# Patient Record
Sex: Male | Born: 1985 | Race: White | Hispanic: No | Marital: Married | Smoking: Never smoker
Health system: Southern US, Community
[De-identification: ages and names within clinical notes are randomized; demographics above are authoritative.]

## PROBLEM LIST (undated history)

## (undated) DIAGNOSIS — H409 Unspecified glaucoma: Secondary | ICD-10-CM

## (undated) DIAGNOSIS — H269 Unspecified cataract: Secondary | ICD-10-CM

## (undated) DIAGNOSIS — H509 Unspecified strabismus: Secondary | ICD-10-CM

## (undated) DIAGNOSIS — Z9989 Dependence on other enabling machines and devices: Secondary | ICD-10-CM

## (undated) DIAGNOSIS — H53001 Unspecified amblyopia, right eye: Secondary | ICD-10-CM

## (undated) DIAGNOSIS — Z973 Presence of spectacles and contact lenses: Secondary | ICD-10-CM

## (undated) DIAGNOSIS — G4730 Sleep apnea, unspecified: Secondary | ICD-10-CM

## (undated) DIAGNOSIS — H179 Unspecified corneal scar and opacity: Secondary | ICD-10-CM

## (undated) DIAGNOSIS — Q134 Other congenital corneal malformations: Secondary | ICD-10-CM

## (undated) DIAGNOSIS — Q1389 Other congenital malformations of anterior segment of eye: Secondary | ICD-10-CM

## (undated) HISTORY — PX: CATARACT EXTRACTION, BILATERAL: SHX1313

## (undated) HISTORY — DX: Unspecified cataract: H26.9

## (undated) HISTORY — DX: Unspecified glaucoma: H40.9

## (undated) HISTORY — DX: Unspecified strabismus: H50.9

## (undated) HISTORY — DX: Unspecified amblyopia, right eye: H53.001

## (undated) HISTORY — DX: Other congenital malformations of anterior segment of eye: Q13.89

## (undated) HISTORY — DX: Unspecified corneal scar and opacity: H17.9

## (undated) HISTORY — DX: Other congenital corneal malformations: Q13.4

---
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
## 2004-11-16 ENCOUNTER — Ambulatory Visit (INDEPENDENT_AMBULATORY_CARE_PROVIDER_SITE_OTHER): Payer: Self-pay

## 2005-11-09 ENCOUNTER — Ambulatory Visit (INDEPENDENT_AMBULATORY_CARE_PROVIDER_SITE_OTHER): Payer: Self-pay | Admitting: Ophthalmology

## 2006-10-24 ENCOUNTER — Encounter (INDEPENDENT_AMBULATORY_CARE_PROVIDER_SITE_OTHER): Payer: No Typology Code available for payment source | Admitting: Ophthalmology

## 2006-11-04 ENCOUNTER — Ambulatory Visit (INDEPENDENT_AMBULATORY_CARE_PROVIDER_SITE_OTHER): Payer: Self-pay | Admitting: Ophthalmology

## 2006-11-23 ENCOUNTER — Ambulatory Visit
Admission: RE | Admit: 2006-11-23 | Discharge: 2006-11-23 | Disposition: A | Discharge status: Home / Self Care | Payer: Self-pay | Source: Ambulatory Visit

## 2007-10-30 ENCOUNTER — Ambulatory Visit: Payer: No Typology Code available for payment source

## 2007-11-10 ENCOUNTER — Encounter (INDEPENDENT_AMBULATORY_CARE_PROVIDER_SITE_OTHER): Payer: No Typology Code available for payment source | Admitting: Ophthalmology

## 2007-12-22 ENCOUNTER — Ambulatory Visit
Admission: RE | Admit: 2007-12-22 | Discharge: 2007-12-22 | Disposition: A | Discharge status: Home / Self Care | Payer: No Typology Code available for payment source | Attending: Ophthalmology | Admitting: Ophthalmology

## 2007-12-22 ENCOUNTER — Ambulatory Visit (INDEPENDENT_AMBULATORY_CARE_PROVIDER_SITE_OTHER): Payer: No Typology Code available for payment source | Admitting: Ophthalmology

## 2007-12-22 DIAGNOSIS — H53009 Unspecified amblyopia, unspecified eye: Secondary | ICD-10-CM | POA: Insufficient documentation

## 2007-12-22 DIAGNOSIS — H53149 Visual discomfort, unspecified: Secondary | ICD-10-CM | POA: Insufficient documentation

## 2007-12-22 DIAGNOSIS — H269 Unspecified cataract: Secondary | ICD-10-CM | POA: Insufficient documentation

## 2007-12-22 DIAGNOSIS — Z9849 Cataract extraction status, unspecified eye: Secondary | ICD-10-CM | POA: Insufficient documentation

## 2007-12-22 DIAGNOSIS — H179 Unspecified corneal scar and opacity: Secondary | ICD-10-CM | POA: Insufficient documentation

## 2007-12-22 DIAGNOSIS — Z961 Presence of intraocular lens: Secondary | ICD-10-CM | POA: Insufficient documentation

## 2007-12-22 DIAGNOSIS — H543 Unqualified visual loss, both eyes: Secondary | ICD-10-CM | POA: Insufficient documentation

## 2007-12-22 DIAGNOSIS — H43399 Other vitreous opacities, unspecified eye: Secondary | ICD-10-CM | POA: Insufficient documentation

## 2008-02-12 ENCOUNTER — Ambulatory Visit
Admission: RE | Admit: 2008-02-12 | Discharge: 2008-02-12 | Disposition: A | Discharge status: Home / Self Care | Payer: No Typology Code available for payment source

## 2008-02-12 ENCOUNTER — Encounter (HOSPITAL_COMMUNITY): Payer: Self-pay

## 2008-02-14 ENCOUNTER — Ambulatory Visit
Admission: RE | Admit: 2008-02-14 | Discharge: 2008-02-14 | Disposition: A | Discharge status: Home / Self Care | Payer: No Typology Code available for payment source

## 2008-02-15 ENCOUNTER — Ambulatory Visit (INDEPENDENT_AMBULATORY_CARE_PROVIDER_SITE_OTHER): Payer: No Typology Code available for payment source | Admitting: Medical

## 2008-02-15 ENCOUNTER — Ambulatory Visit
Admission: RE | Admit: 2008-02-15 | Discharge: 2008-02-15 | Disposition: A | Discharge status: Home / Self Care | Payer: No Typology Code available for payment source | Attending: Medical | Admitting: Medical

## 2008-02-15 DIAGNOSIS — Z01818 Encounter for other preprocedural examination: Secondary | ICD-10-CM | POA: Insufficient documentation

## 2008-02-15 NOTE — Patient Instructions (Signed)
Patient for routine EKG review with notable bradycardia. Will review and advise.

## 2008-02-15 NOTE — Progress Notes (Signed)
 [employer] Leon  [id number]  [birth year]    02/15/2008      [name]  [address]  [address]  [place], [place] [address]    Chief Complaint   Patient presents with   . Pre-op     pending eye surgery on 9/28         History of Present Illness:  Patient is a 22 year old referred for preoperative evaluation pending left eye cataract excision scheduled September 28, with Dr. GRAYSON. 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 Patient reports prior right eye surgery, without complications reported.        No past medical history on file: Patient denies any history of hypertension, coronary disease, valvular disease, COPD/asthma, diabetes, renal insufficiency, or cardiomyopathy. No recent hospitalizations or illnesses reported. No current medications      No past surgical history on file. Patient denies surgical history, other than prior right eye surgery as stated.    No current outpatient prescriptions on file.         Allergies   Allergen Reactions   . Nkda (No Known Drug Allergies)          No family history on file. non-contributory    History   Social History   . Marital Status: Single     Spouse Name: N/A     Number of Children: N/A   . Years of Education: N/A   Occupational History   . Not on file.   Social History Main Topics   . Tobacco Use: Never   . Alcohol Use: Yes      socially   . Drug Use: No   . Sexually Active: Not on file   Other Topics Concern   . Abuse/domestic Violence No   . Caffeine Concern No   . Seat Belt No     Sometimes   . Special Diet No   . Uses Gait Assitive Device ([employer]) No   Social History Narrative    Patient is a Consulting civil engineer at [employer].            Review Of Systems:    Objective:BP 110/60  Pulse 48  Temp (Src) 36.1 C (97 F) (Tympanic)  Ht 1.829 m (6')  Wt 87.544 kg (193 lb)  SpO2 99%  Patient is alert, cooperative, in no acute distress. Vital signs adequate, but  noted bradycardia.  Patient's pharynx was clear. Neck was supple without adenopathy or thyromegaly. No carotid bruits noted. Lungs were clear to auscultate without wheezing or rhonchi. Heart sounds were regular rate with slow rhythm without significant murmur. Abdomen soft without tenderness or organomegaly. Extremities without edema or deformity.    Pre Op Assessment & Plan/Recommendations:  1. Left eye cataract,  for excision. Due to patient's bradycardia, EKG was ordered and reviewed. First degree AV block noted, but without significant additional abnormality. No additional preoperative testing recommended. No increased perioperative risk identified. This was reviewed with Dr. Veleria.      Frederick Proby, PA-C

## 2008-02-19 ENCOUNTER — Encounter (HOSPITAL_COMMUNITY): Payer: Self-pay

## 2008-02-19 ENCOUNTER — Encounter (HOSPITAL_BASED_OUTPATIENT_CLINIC_OR_DEPARTMENT_OTHER): Payer: No Typology Code available for payment source | Admitting: Ophthalmology

## 2008-02-19 ENCOUNTER — Encounter (HOSPITAL_COMMUNITY)
Admission: RE | Disposition: A | Discharge status: Home / Self Care | Payer: Self-pay | Source: Ambulatory Visit | Attending: Ophthalmology

## 2008-02-19 ENCOUNTER — Inpatient Hospital Stay
Admission: RE | Admit: 2008-02-19 | Discharge: 2008-02-19 | Disposition: A | Discharge status: Home / Self Care | Payer: No Typology Code available for payment source | Attending: Ophthalmology | Admitting: Ophthalmology

## 2008-02-19 DIAGNOSIS — Q120 Congenital cataract: Secondary | ICD-10-CM | POA: Insufficient documentation

## 2008-02-19 HISTORY — PX: HX CATARACT REMOVAL: SHX102

## 2008-02-19 SURGERY — PHACO WITH INTRAOCULAR LENS
Anesthesia: Monitor Anesthesia Care | Site: Eye | Laterality: Left | Wound class: Clean Wound: Uninfected operative wounds in which no inflammation occurred

## 2008-02-19 MED ORDER — PREDNISOLONE ACETATE 1 % EYE DROPS,SUSPENSION
1.00 [drp] | Freq: Four times a day (QID) | OPHTHALMIC | Status: DC
Start: 2008-02-19 — End: 2008-07-23

## 2008-02-19 MED ORDER — FLURBIPROFEN 0.03 % EYE DROPS
1.00 [drp] | OPHTHALMIC | Status: AC
Start: 2008-02-19 — End: 2008-02-19
  Administered 2008-02-19 (×3): 1 [drp] via OPHTHALMIC

## 2008-02-19 MED ORDER — BALANCED SALT SOLUTION COMBINATION NO.2 INTRAOCULAR IRRIGATION
INTRAOCULAR | Status: DC
Start: 2008-02-19 — End: 2008-02-20
  Filled 2008-02-19: qty 1

## 2008-02-19 MED ORDER — MOXIFLOXACIN 0.5 % EYE DROPS
1.00 [drp] | OPHTHALMIC | Status: AC
Start: 2008-02-19 — End: 2008-02-19
  Administered 2008-02-19 (×3): 1 [drp] via OPHTHALMIC

## 2008-02-19 MED ORDER — CYCLOPENTOLATE 1 % EYE DROPS
1.00 [drp] | OPHTHALMIC | Status: AC
Start: 2008-02-19 — End: 2008-02-19
  Administered 2008-02-19 (×3): 1 [drp] via OPHTHALMIC

## 2008-02-19 MED ORDER — PHENYLEPHRINE 2.5 % EYE DROPS
1.00 [drp] | OPHTHALMIC | Status: AC
Start: 2008-02-19 — End: 2008-02-19
  Administered 2008-02-19 (×3): 1 [drp] via OPHTHALMIC

## 2008-02-19 MED ORDER — MOXIFLOXACIN 0.5 % EYE DROPS
1.00 [drp] | Freq: Four times a day (QID) | OPHTHALMIC | Status: DC
Start: 2008-02-19 — End: 2008-04-02

## 2008-02-19 MED ORDER — HYALURONIDASE (OVINE) 200 UNIT/ML INJECTION SOLUTION
Freq: Once | INTRAMUSCULAR | Status: AC
Start: 2008-02-19 — End: 2008-02-19

## 2008-02-19 MED ORDER — LACTATED RINGERS INTRAVENOUS SOLUTION
INTRAVENOUS | Status: DC
Start: 2008-02-19 — End: 2008-02-20

## 2008-02-19 MED ORDER — TOBRAMYCIN-DEXAMETHASONE 0.3 %-0.1 % EYE OINTMENT
TOPICAL_OINTMENT | Freq: Once | OPHTHALMIC | Status: DC | PRN
Start: 2008-02-19 — End: 2008-02-20

## 2008-02-19 MED ORDER — BALANCED SALT SOLUTION COMBINATION NO.2 INTRAOCULAR IRRIGATION
15.00 mL | Freq: Once | INTRAOCULAR | Status: DC | PRN
Start: 2008-02-19 — End: 2008-02-20
  Administered 2008-02-19: 15 mL

## 2008-02-19 SURGICAL SUPPLY — 19 items
APPLICATOR COT TIP STRL 3IN 9318100 100/CS (WOUND CARE SUPPLY) ×2 IMPLANT
CONV USE ITEM 156524 - ADHESIVE TISSUE EXOFIN 1.0ML_PREMIERPRO EXOFIN (SEALANTS) IMPLANT
GARTER EYE ASST_28-6536 (OPHTHALMIC SUPPLIES (NOT LENS)) ×2 IMPLANT
KNIFE OPTH 2.5MM BVR XSTR 55D CRSNT SS MATTE FNSH BVL UP FLXB STRL LF  DISP (OPHTHALMIC SUPPLIES (NOT LENS)) ×2 IMPLANT
KNIFE OPTH SLT BLU OPM 22.5D STR STAB INCS STRL LF (SURGICAL CUTTING SUPPLIES) ×2 IMPLANT
LENS IOL 0 D +21.5 DIOP MOD L_BICONVEX ACRYSOF NATURAL (Lens) ×2 IMPLANT
NEEDLE OPTH 1.5IN 25GA ATKNSN RTRBLBR LF (NEEDLES & SYRINGE SUPPLIES) ×2 IMPLANT
PACK CATARACT BX/6 (TRAY) ×2 IMPLANT
PACK VITRECT INFNT STRL DISP (OPHTHALMIC SUPPLIES (NOT LENS)) IMPLANT
PAD EYE 2.62X1.62IN SURECARE COTTON ABS LF  STRL (OPHTHALMIC SUPPLIES (NOT LENS)) ×2 IMPLANT
PAD EYE SURE CARE GAUZE 82911_25EA/TY 24TY/CS (OPTHALMIC SUPPLIES (NOT LENS)) ×1
PEN SURG MRKNG WRITESITE + RLR LBL SET 3X DARKER FORMULATE GNTN VIOL INK STRL LF  CHLRPRP (MISCELLANEOUS PT CARE ITEMS) ×2 IMPLANT
PEN SURG MRKNG WRITESITE + SKN_RLR LBL SET 3X DARKER (MISCELLANEOUS PT CARE ITEMS) ×1
SET BLOOD COL 23GA 12IN BD VAC SAF-LOK TUBE NO LL ADPR BTRFLY NEEDLE .75IN LIGHT BLU YW STRL LF (IV TUBING & ACCESSORIES) ×2 IMPLANT
SHIELD EYE 76.2X60.3MM ASST AL EY GRTR FOX CVR (OPHTHALMIC SUPPLIES (NOT LENS)) ×2 IMPLANT
SUTURE 10-0 CS160-6 ETHILON 12IN BLK 2 ARM MONOF NONAB (SUTURE/WOUND CLOSURE) ×2 IMPLANT
SUTURE ETHILON 10.0 CS140 6 ET9003G (SUTURE/WOUND CLOSURE) ×2 IMPLANT
SUTURE SOFSILK 4-0 HE3 CUT S1783K 12/BX (SUTURE/WOUND CLOSURE) IMPLANT
SYRINGE HYPO 12CC W/LL TIP 1181200777T 100/BX (Syringes w/ o Needles) ×2 IMPLANT

## 2008-02-19 NOTE — Discharge Instructions (Signed)
SURGICAL DISCHARGE INSTRUCTIONS     Dr. Lilia Pro Wiley  performed your [facility] WITH INTRAOCULAR LENS, VITRECTOMY ANTERIOR today at the [facility]    [facility]:  Monday through Friday from 6 a.m. - 7 p.m.: [phone]  Between 7 p.m. - 6 a.m., weekends and holidays:  Call [facility] at [phone] or [phone].    PLEASE SEE WRITTEN HANDOUTS AS DISCUSSED BY YOUR NURSE:  Eye surgery    SIGNS AND SYMPTOMS OF A WOUND / INCISION INFECTION   Be sure to watch for the following:   Increase in redness or red streaks near or around the wound or incision.  Increase in pain that is intense or severe and cannot be relieved by the pain medication that your doctor has given you.  Increase in swelling that cannot be relieved by elevation of a body part, or by applying ice, if permitted.  Increase in drainage, or if yellow / green in color and smells bad. This could be on a dressing or a cast.  Increase in fever for longer than 24 hours, or an increase that is higher than 101 degrees Fahrenheit (normal body temperature is 98 degrees Fahrenheit). The incision may feel warm to the touch.    **CALL YOUR DOCTOR IF ONE OR MORE OF THESE SIGNS / SYMPTOMS SHOULD OCCUR.    ANESTHESIA INFORMATION   LOCAL ANESTHETIC:  You have receieved a local anesthetic, the effects should disappear in a few hours. and ANESTHESIA -- ADULT PATIENTS:  You have received intravenous sedation / general anesthesia, and you may feel drowsy and light-headed for several hours. You may even experience some forgetfulness of the procedure. DO NOT DRIVE A MOTOR VEHICLE or perform any activity requiring complete alertness or coordination until you feel fully awake in about 24-48 hours. Do not drink alcoholic beverages for at least 24 hours. Do not stay alone, you must have a responsible adult available to be with you. You may also experience a dry mouth or nausea for 24 hours. This is a normal side effect and will disappear as the effects  of the medication wear off.    REMEMBER   If you experience any difficulty breathing, chest pain, bleeding that you feel is excessive, persistent nausea or vomiting or for any other concerns:  Call your physician Dr. Paris Lore at [phone] or [phone]. You may also ask to have the EYE doctor on call paged. They are available to you 24 hours a day.    SPECIAL INSTRUCTIONS / COMMENTS   DO NOT REMOVE EYE PATCH.  PHYSCIAN WIL;L REMOVE AT FOLLOW UP APPOINTMENT  TOMORROW.    FOLLOW-UP APPOINTMENTS   Please call patient services at [phone] or [phone] to schedule a date / time of return. They are open Monday - Friday from 7:30 am - 5:00 pm.

## 2008-02-19 NOTE — OR Nursing (Signed)
Duovisc 1.05ml in left eye per Dr. Wiley

## 2008-02-19 NOTE — OR Nursing (Signed)
CDE 2.58

## 2008-02-19 NOTE — OR PostOp (Signed)
Discharged with belongings via wheelchair accompanied by AMANDA DEBARR PCA

## 2008-02-20 ENCOUNTER — Encounter (INDEPENDENT_AMBULATORY_CARE_PROVIDER_SITE_OTHER): Payer: No Typology Code available for payment source | Admitting: Ophthalmology

## 2008-02-20 NOTE — OR Surgeon (Signed)
[facility]   DEPARTMENT OF OPHTHALMOLOGY    OPERATION SUMMARY    PATIENT NAME: DEIONTE, SPIVACK [facility] MWUXLK:[id number]  DATE OF SERVICE:02/19/2008  DATE OF BIRTH: 1985/11/09    PREOPERATIVE DIAGNOSIS: Cataract, posterior polar (left eye).    NAME OF PROCEDURE: Cataract extraction via phacoemulsification and implantation of posterior chamber intraocular lens, left eye.    ANESTHESIA: Local with standby.    SURGEONS: Courtney Heys MD (staff), Lacie Draft MD (assistant).    COMPLICATIONS: None.     DESCRIPTION OF PROCEDURE: The patient was taken to the operating room after being properly identified by me. He was given a peribulbar block using 2% lidocaine and 100 units of Vitrase per 10 mL. A specialized Atkinson needle was used to deliver 5 mL total volume followed by direct pressure to soften the globe. The periocular skin was prepped with Betadine solution with special attention to the lid margins and some of that solution placed on the eye surface. This was cleansed with saline and dried and the patient draped with an adhesive ophthalmic drape, incised and turned around the lids to exclude them from the operative field. Eyelids were separated with a wire speculum and a paracentesis site made inferiorly with a metal keratome allowing Viscoat to be instilled into the anterior chamber to deepen it. A clear temporal corneal incision was fashioned. The capsule flap was raised with a sharp cystitome and extended in a circular fashion. Because this was a posterior polar cataract, hydrodissection was done in 4 quadrants very gently, ensuring that the hydrodissection wave did not go posteriorly to the area of the cataract. The lens was hydrodelineated and emulsified using a CDE of 2.58. Cortex and epinucleus were removed with the I/A tip using an irrigant of BSS with 1 mL of 1:1000 epinephrine per 500-mL bottle. The implant, an SN60WF, 21.5 diopter power lens, serial #[id number], was placed in the [facility] inserter and used to insert the lens directly into the capsular bag. It was rotated so its haptics were at 12 and 6 o'clock, and as much viscoelastic as possible was evacuated with the I/A unit. The wound was closed with single interrupted 10-0 nylon suture and checked for leaks, and none were seen. TobraDex ointment was instilled and the eye protected with a patch and shield. The patient was taken from the operating room to the recovery room in good condition.      Darylene Price, MD  Associate Professor   Cornea and External Disease Service  [place] Department of Ophthalmology    LAW/[id number];D: 02/19/2008 14:45:44; T: 02/20/2008 59:56:38

## 2008-02-27 ENCOUNTER — Encounter (INDEPENDENT_AMBULATORY_CARE_PROVIDER_SITE_OTHER): Payer: No Typology Code available for payment source | Admitting: Ophthalmology

## 2008-04-02 ENCOUNTER — Encounter (INDEPENDENT_AMBULATORY_CARE_PROVIDER_SITE_OTHER): Payer: No Typology Code available for payment source | Admitting: Ophthalmology

## 2008-07-23 ENCOUNTER — Encounter (INDEPENDENT_AMBULATORY_CARE_PROVIDER_SITE_OTHER): Payer: No Typology Code available for payment source | Admitting: Ophthalmology

## 2008-07-24 ENCOUNTER — Encounter (INDEPENDENT_AMBULATORY_CARE_PROVIDER_SITE_OTHER): Payer: No Typology Code available for payment source | Admitting: Ophthalmology

## 2009-07-23 ENCOUNTER — Ambulatory Visit (INDEPENDENT_AMBULATORY_CARE_PROVIDER_SITE_OTHER): Payer: BC Managed Care – PPO | Admitting: Ophthalmology

## 2009-07-23 ENCOUNTER — Encounter (INDEPENDENT_AMBULATORY_CARE_PROVIDER_SITE_OTHER): Payer: Self-pay | Admitting: Ophthalmology

## 2009-07-23 NOTE — Progress Notes (Signed)
[facility]  [facility]  [address]  [place], [place] [address]  [phone]    Patient Name: Frederick Leon  MRN# [id number]    Date of Service: 07/23/2009    Chief Complaint: Chief Complaint   Patient presents with   . Yearly Eye Exam     IOL-OU   . Other     Gust Brooms   . Amblyopia     OD         History of Present Illness  Subjective: [employer] is a 24 y.o. male here today for Yearly Eye Exam, Other and Amblyopia    He complains of occas floaters OS for years.  Reports vision is worse when tired but otherwise stable.  Denies wearing glasses for distance and notes reading glasses give him a headache.  No new concerns.      Past History  No current outpatient prescriptions on file.       Allergies   Allergen Reactions   . Nkda (No Known Drug Allergies)        History reviewed.  No pertinent past medical history.  Past Surgical History   Procedure Date   . Hx other      bil rectus muscle eye surgery   . Hx cataract removal      right eye       Family History  Family History   Problem Relation   . Healthy Mother   . Healthy Father       Social History  History   Substance Use Topics   . Tobacco Use: Never   . Alcohol Use: Yes      socially       Review of Systems  All others negative: yes (Healthy)    Faylene Million, COA

## 2009-07-23 NOTE — Progress Notes (Signed)
Doing well  Post cat surgery     OS    Darylene Price, MD 07/23/2009, 2:05 PM

## 2010-10-30 NOTE — Letter (Signed)
[place] Department of Ophthalmology  [facility]  [address]  [place], [place] [address]      INTERNAL OPHTHALMOLOGY LETTER    PATIENT NAME: Frederick Leon, Frederick Leon  CHART NUMBER: [id number]  DATE OF BIRTH: [birth year]  DATE OF SERVICE: 10/30/2007    October 30, 2007     TO:  Courtney Heys, MD    I saw Frederick Leon on October 30, 2007.  Please refer to his SRS notes.  Cutting to the chase, he has a posterior polar cataract in his left eye, which has reached a point where it is causing him some difficulties in his scholastic and personal life activities.  I suggested he see you for consideration of cataract surgery with lens implantation.    Please let me know if you have any questions.      Sincerely,      Nadyne Coombes, MD  Associate Professor; Section of [facility] Department of Ophthalmology    [id number]; D: 10/30/2007 17:42:22; T: 11/01/2007 07:37:50    cc: Darylene Price MD      Shirleen Schirmer

## 2010-12-23 ENCOUNTER — Ambulatory Visit: Payer: BC Managed Care – PPO | Attending: Ophthalmology | Admitting: Ophthalmology

## 2010-12-23 ENCOUNTER — Encounter (INDEPENDENT_AMBULATORY_CARE_PROVIDER_SITE_OTHER): Payer: Self-pay | Admitting: Ophthalmology

## 2010-12-23 DIAGNOSIS — H269 Unspecified cataract: Secondary | ICD-10-CM | POA: Insufficient documentation

## 2010-12-23 DIAGNOSIS — H53009 Unspecified amblyopia, unspecified eye: Secondary | ICD-10-CM | POA: Insufficient documentation

## 2010-12-23 DIAGNOSIS — H519 Unspecified disorder of binocular movement: Secondary | ICD-10-CM | POA: Insufficient documentation

## 2010-12-23 DIAGNOSIS — Q139 Congenital malformation of anterior segment of eye, unspecified: Secondary | ICD-10-CM | POA: Insufficient documentation

## 2010-12-23 DIAGNOSIS — H179 Unspecified corneal scar and opacity: Secondary | ICD-10-CM | POA: Insufficient documentation

## 2010-12-23 NOTE — Progress Notes (Signed)
Doing well post    Cataract sugery          "I utilized a scribe during this service. I have reviewed the ROS and PFSH, and exam elements performed as per department protocol for ancillary personnel, and agree with each as documented.  Any exceptions are noted separately.  The HPI, other Exam elements, and Assessment and Plan as documented by the scribe reflect the service that I personally rendered."     Please refer to my [facility] EXAM for specific details of the the examination.    Courtney Heys, MD 12/23/2010, 9:06 AM

## 2010-12-23 NOTE — Progress Notes (Signed)
 [facility]  [facility]  Operated by [facility]  [address]  [place] [address]  Dept: [phone]    Patient Name: Frederick Leon  MRN# [id number]    Date of Service: 12/23/2010    Chief Complaint:   Chief Complaint   Patient presents with   . Yearly Eye Exam     amblyopia od ; pseudo OU 09; cornea repositioning od 85 ; strabismus sx 89   . Eye Pain     occasional eye pain left eye lasting few sec to longer esp when tired or eyes overworked like pressure feeling in eye       History of Present Illness  Subjective: [facility] Leon is a 25 y.o. male here today for Yearly Eye Exam and Eye Pain    He complains of pt needing new glasses uses reading glasses would like pair that can do both near and distance    "I acted as scribe for a portion of this encounter.  The history of present illness, any exam elements documented excluding Visual acuity, Lensometry, Pupil assessment, Confrontational visual fields, Tonometry results, Extraocular motility assessment, Refractometry results, and Assessment of mood and orientation in addition to the assessment and plan were entered into the electronic record in my role as scribe."    Past History  No current outpatient prescriptions on file.     Allergies   Allergen Reactions   . Nkda (No Known Drug Allergies)      Past Medical History   Diagnosis Date   . [name]    . Strabismus    . Amblyopia of right eye    . 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 Cataract      bilateral        Review of Systems  ENT:  ( hearing loss, earaches, stuffy nose, sinuses, teeth, gums, throat, etc.): Negative  CARDIOVASCULAR :  ( high blood pressure, abdnormal heart rate, heart attack, chest pain, high cholesterol, heart murmur or defect, etc.): Negative  RESPIRATORY:  (asthma, bronchitis, emphysema, shortness of breath, wheezing, cough, etc.): Negative  GASTROINTESTINAL:  ( stomach ulcers, heartburn, constipation, liver  disease, diarrhea, jaundice, etc): Negative  UROGENITAL:   ( problems w/ urination, kidneys, prostate, kidney stones, dialysis, bladder,  etc.): Negative  HEM/LYMPH:  ( bleeding, anemia, bruising, cancer, sickle cell, swollen nodes, etc.): Negative  NEUROLOGICAL: ( stroke, seizures, numbness, weakness, paralysis, delays, learning, speech, etc.): Negative    Almarie Sarin, COMT 12/23/2010, 8:36 AM

## 2012-02-15 ENCOUNTER — Ambulatory Visit: Payer: BC Managed Care – PPO | Attending: Ophthalmology | Admitting: Ophthalmology

## 2012-02-15 ENCOUNTER — Encounter (INDEPENDENT_AMBULATORY_CARE_PROVIDER_SITE_OTHER): Payer: Self-pay | Admitting: Ophthalmology

## 2012-02-15 DIAGNOSIS — Z961 Presence of intraocular lens: Secondary | ICD-10-CM | POA: Insufficient documentation

## 2012-02-15 DIAGNOSIS — H53009 Unspecified amblyopia, unspecified eye: Secondary | ICD-10-CM | POA: Insufficient documentation

## 2012-02-15 NOTE — Progress Notes (Signed)
 [facility]  [facility]  Operated by [facility]  [address]  [place] [address]  Dept: [phone]    Patient Name: [facility] Leon  MRN# [id number]    Date of Service: 02/15/2012    Chief Complaint:   Chief Complaint   Patient presents with   . Yearly Eye Exam   . Amblyopia     OD   . Follow Up Intraocular lens (iol)     OU     Patient here for yearly f/u amblyopia OD, IOL OU. Patient states vision stable OU since last year. Patient denies any flashes or floaters in vision.  Past History  No current outpatient prescriptions on file.     No current facility-administered medications for this visit.     Allergies   Allergen Reactions   . Nkda (No Known Drug Allergies)      Past Medical History   Diagnosis Date   . [name]    . Strabismus    . Amblyopia of right eye    . 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 Cataract      bilateral      Past Surgical History   Procedure Laterality Date   . Hx strabismus surgery  1989     bil rectus muscle eye surgery   . Hx cataract removal  02/19/08     bilateral   . Hx eye surgery  7/85      corneal rotation right eye .      Family History  Family History   Problem Relation Age of Onset   . Healthy Mother    . Healthy Father      Social History  History   Substance Use Topics   . Smoking status: Never Smoker    . Smokeless tobacco: Never Used   . Alcohol Use: Yes      socially     Review of Systems  ENT:  ( hearing loss, earaches, stuffy nose, sinuses, teeth, gums, throat, etc.): Negative  CARDIOVASCULAR :  ( high blood pressure, abdnormal heart rate, heart attack, chest pain, high cholesterol, heart murmur or defect, etc.): Negative  RESPIRATORY:  (asthma, bronchitis, emphysema, shortness of breath, wheezing, cough, etc.): Negative  GASTROINTESTINAL:  ( stomach ulcers, heartburn, constipation, liver disease, diarrhea, jaundice, etc): Negative  UROGENITAL:   ( problems w/ urination, kidneys, prostate, kidney  stones, dialysis, bladder,  etc.): Negative  HEM/LYMPH:  ( bleeding, anemia, bruising, cancer, sickle cell, swollen nodes, etc.): Negative  MUSCULO: ( muscle aches, joint pain, arthritis, fractures, etc.): Negative  INTEGUMENTARY: ( rashes, psoriasis, eczema, lumps,  etc.): Negative  NEUROLOGICAL: ( stroke, seizures, numbness, weakness, paralysis, delays, learning, speech, etc.): Negative  PSYCHIATRIC: ( anxiety, depression, ADD, etc.): Negative  ENDOCRINE: (diabetes, thyroid, etc.): Negative  ALL/IMM: (foods, pollens, infectious disease, etc.): Negative  All others negative: yes    Niklaus Mamaril D Akito Boomhower, COMT 02/15/2012, 8:28 AM

## 2012-02-15 NOTE — Progress Notes (Signed)
________________________________________________________________________  "I scribed a portion of the encounter including Chief Complaint, HPI, Impression, Plan, and exam elements excluding visual acuity, lensometry, pupil assessment, confrontational visual fields, tonometry results, extraocular motility assessment, refractometry results, and assessment of mood and orientation. I scribed this note at the request of the physician who personally performed the services documented."    History of Present Illness  Subjective: [facility] Leon is a 26 y.o. male here today for Yearly Eye Exam, Amblyopia and Follow Up Intraocular lens (iol)  HPI: Pt in for annual f/u CE c IOL OS performed 02/19/2008.  S/P CE c IOL OD.  Hx congenital cataract.  Amblyopia OD.  Pt has no eye-related complaints.    Assessment/Plan:  Good IOPs.  Healthy retina exam.  IOL implants secure.          I scribed this note at the request of the physician who personally performed the services documented.  Talbert Forest, SCRIBE 02/15/2012, 8:34 AM  ________________________________________________________________________        [facility]  Operated by [facility]  [address]  [place] [address]  Dept: [phone]    Patient Name: Frederick Leon  MRN# [id number]    Date of Service: 02/15/2012    Chief Complaint:   Chief Complaint   Patient presents with   . Yearly Eye Exam   . Amblyopia     OD   . Follow Up Intraocular lens (iol)     OU     Patient here for yearly f/u amblyopia OD, IOL OU. Patient states vision stable OU since last year. Patient denies any flashes or floaters in vision.  Past History  No current outpatient prescriptions on file.     No current facility-administered medications for this visit.     Allergies   Allergen Reactions   . Nkda (No Known Drug Allergies)      Past Medical History   Diagnosis Date   . [name]    . Strabismus    . Amblyopia of right eye    . Cornea scar 1985       right eye due to scar corna repositioning   . Cataract      bilateral      Past Surgical History   Procedure Laterality Date   . Hx strabismus surgery  1989     bil rectus muscle eye surgery   . Hx cataract removal  02/19/08     bilateral   . Hx eye surgery  7/85      corneal rotation right eye .      Family History  Family History   Problem Relation Age of Onset   . Healthy Mother    . Healthy Father      Social History  History   Substance Use Topics   . Smoking status: Never Smoker    . Smokeless tobacco: Never Used   . Alcohol Use: Yes      socially     Review of Systems  ENT:  ( hearing loss, earaches, stuffy nose, sinuses, teeth, gums, throat, etc.): Negative  CARDIOVASCULAR :  ( high blood pressure, abdnormal heart rate, heart attack, chest pain, high cholesterol, heart murmur or defect, etc.): Negative  RESPIRATORY:  (asthma, bronchitis, emphysema, shortness of breath, wheezing, cough, etc.): Negative  GASTROINTESTINAL:  ( stomach ulcers, heartburn, constipation, liver disease, diarrhea, jaundice, etc): Negative  UROGENITAL:   ( problems w/ urination, kidneys, prostate, kidney stones, dialysis, bladder,  etc.):  Negative  HEM/LYMPH:  ( bleeding, anemia, bruising, cancer, sickle cell, swollen nodes, etc.): Negative  MUSCULO: ( muscle aches, joint pain, arthritis, fractures, etc.): Negative  INTEGUMENTARY: ( rashes, psoriasis, eczema, lumps,  etc.): Negative  NEUROLOGICAL: ( stroke, seizures, numbness, weakness, paralysis, delays, learning, speech, etc.): Negative  PSYCHIATRIC: ( anxiety, depression, ADD, etc.): Negative  ENDOCRINE: (diabetes, thyroid, etc.): Negative  ALL/IMM: (foods, pollens, infectious disease, etc.): Negative  All others negative: yes    Traci D Vavrek, COMT 02/15/2012, 8:28 AM          ________________________________________________________________________  "I scribed a portion of the encounter including Chief Complaint, HPI, Impression, Plan, and exam elements excluding visual acuity,  lensometry, pupil assessment, confrontational visual fields, tonometry results, extraocular motility assessment, refractometry results, and assessment of mood and orientation. I scribed this note at the request of the physician who personally performed the services documented."    History of Present Illness  Subjective: [facility] Leon is a 26 y.o. male here today for Yearly Eye Exam, Amblyopia and Follow Up Intraocular lens (iol)  HPI: Pt in for annual f/u CE c IOL OS performed 02/19/2008.  S/P CE c IOL OD.  Hx congenital cataract.  Amblyopia OD.  Pt has no eye-related complaints.    Assessment/Plan:  Good IOPs.  Healthy retina exam.  IOL implants secure.          I scribed this note at the request of the physician who personally performed the services documented.  Talbert Forest, SCRIBE 02/15/2012, 8:34 AM  ________________________________________________________________________        I have reviewed and confirmed the ROS, PFSH, and exam elements performed and documented by the technician. The scribed portion of the progress note was scribed on my behalf and at my direction.  I have reviewed and attest to the accuracy of the note.   Courtney Heys, MD 02/15/2012, 9:06 AM

## 2012-07-25 ENCOUNTER — Ambulatory Visit (INDEPENDENT_AMBULATORY_CARE_PROVIDER_SITE_OTHER): Payer: Self-pay | Admitting: Ophthalmology

## 2012-07-25 NOTE — Telephone Encounter (Signed)
Message copied by Gar Gibbon on Tue Jul 25, 2012  1:23 PM  ------       Message from: Marlana Latus       Created: Tue Jul 25, 2012 12:14 PM         >> Marlana Latus 07/25/2012 12:14 PM       Dr Paris Lore       Pt is feeling some pressure behind his eyes, can you work him in this week. He leaves at 2 for work. Leave message   ------

## 2012-07-25 NOTE — Telephone Encounter (Signed)
Scheduled pt to see dr. Paris Lore tomorrow, march 5th at 3:00 pm. Left message for pt to call to confirm

## 2012-07-26 ENCOUNTER — Encounter (INDEPENDENT_AMBULATORY_CARE_PROVIDER_SITE_OTHER): Payer: BC Managed Care – PPO | Admitting: Ophthalmology

## 2012-08-03 ENCOUNTER — Ambulatory Visit: Payer: BC Managed Care – PPO | Attending: Ophthalmology | Admitting: Ophthalmology

## 2012-08-03 DIAGNOSIS — Z9849 Cataract extraction status, unspecified eye: Secondary | ICD-10-CM | POA: Insufficient documentation

## 2012-08-03 DIAGNOSIS — Z961 Presence of intraocular lens: Secondary | ICD-10-CM | POA: Insufficient documentation

## 2012-08-03 DIAGNOSIS — H179 Unspecified corneal scar and opacity: Secondary | ICD-10-CM | POA: Insufficient documentation

## 2012-08-03 DIAGNOSIS — H53009 Unspecified amblyopia, unspecified eye: Secondary | ICD-10-CM | POA: Insufficient documentation

## 2014-05-09 ENCOUNTER — Ambulatory Visit: Payer: BC Managed Care – PPO | Attending: Ophthalmology | Admitting: Ophthalmology

## 2014-05-09 DIAGNOSIS — Z0100 Encounter for examination of eyes and vision without abnormal findings: Secondary | ICD-10-CM | POA: Insufficient documentation

## 2014-05-09 DIAGNOSIS — Q134 Other congenital corneal malformations: Secondary | ICD-10-CM | POA: Insufficient documentation

## 2014-05-09 DIAGNOSIS — Z961 Presence of intraocular lens: Secondary | ICD-10-CM | POA: Insufficient documentation

## 2014-05-09 DIAGNOSIS — H53001 Unspecified amblyopia, right eye: Secondary | ICD-10-CM

## 2014-05-09 DIAGNOSIS — H179 Unspecified corneal scar and opacity: Secondary | ICD-10-CM

## 2014-05-09 DIAGNOSIS — Z9841 Cataract extraction status, right eye: Secondary | ICD-10-CM | POA: Insufficient documentation

## 2014-05-09 DIAGNOSIS — Q1389 Other congenital malformations of anterior segment of eye: Secondary | ICD-10-CM

## 2014-05-09 DIAGNOSIS — H509 Unspecified strabismus: Secondary | ICD-10-CM

## 2014-05-09 DIAGNOSIS — Z9842 Cataract extraction status, left eye: Secondary | ICD-10-CM | POA: Insufficient documentation

## 2014-05-09 NOTE — Progress Notes (Addendum)
[place]  Operated by [facility]  [place] [address]  Dept: [phone]    Patient Name: Frederick Leon  MRN#: [id number]  [place]: 05-10-1986    Date of Service: 05/09/2014    Chief Complaint     Eye Exam          Frederick Leon is a 28 y.o. male who presents today for evaluation/consultation of:  HPI     PDRTC 1 Exam OU   HX:CE c IOL OU.  Pseudophakia OU. Hx amblyopia   Doing well, Vision OU, CC, D/N, seen 08/04/2012  Denies pain / discomort OU.  Denies Flashes or Floaters OU   Denies Curtain / [place]ail OU    Denies RED eye           Last edited by Robley Fries, COA on 05/09/2014  3:25 PM.     ROS     Positive for: Eyes ( HX:CE c IOL OU.  Pseudophakia OU. Hx amblyopia )    Last edited by Robley Fries, [place] on 05/09/2014  3:25 PM. (History)           Robley Fries, [place] 05/09/2014, 15:32     MD Addition to HPI: 28 yo male in for annual exam. Pt states vision stable and denies any pain or discomfort. Pseudophakia OU. Hx amblyopia.     Past Surgical History   Procedure Laterality Date    Hx strabismus surgery  1989     bil rectus muscle eye surgery    Hx cataract removal  02/19/08     bilateral    Hx eye surgery  7/85      corneal rotation right eye .            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   IOP within  normal range, 14 and 15           Documented chief complaint, history of present illness, allergies, review of systems, past medical, past surgical, family and social history were reviewed by me and when necessary I made changes to the technician note.  I also reviewed and agree with the findings documented in ophthalmology exam tab. I discussed the above diagnoses listed in the assessment and the above ophthalmic plan of care with the patient and patient's family. All questions were answered.      No orders of  the defined types were placed in this encounter.       No orders of the defined types were placed in this encounter.       I scribed a portion of the encounter including Chief Complaint, HPI, Impression, Plan, and exam elements excluding visual acuity, lensometry, pupil assessment, confrontational visual fields, tonometry results, extraocular motility assessment, refractometry results, and assessment of mood and orientation. I scribed this note at the request of the physician who personally performed the services documented.  Salome Arnt, [place] 05/09/2014, 15:36    I have reviewed and confirmed the ROS, PFSH, and exam elements performed and documented by the technician. The scribed portion of the progress note was scribed on my behalf and at my direction. I have reviewed and attest to the accuracy of the note.   Dennis Bast, MD 05/09/2014, 15:45

## 2015-06-10 ENCOUNTER — Encounter (INDEPENDENT_AMBULATORY_CARE_PROVIDER_SITE_OTHER): Payer: Self-pay | Admitting: Ophthalmology

## 2015-06-10 ENCOUNTER — Ambulatory Visit: Payer: BC Managed Care – PPO | Attending: Ophthalmology | Admitting: Ophthalmology

## 2015-06-10 DIAGNOSIS — Z9841 Cataract extraction status, right eye: Secondary | ICD-10-CM | POA: Insufficient documentation

## 2015-06-10 DIAGNOSIS — H179 Unspecified corneal scar and opacity: Secondary | ICD-10-CM | POA: Insufficient documentation

## 2015-06-10 DIAGNOSIS — H53001 Unspecified amblyopia, right eye: Secondary | ICD-10-CM

## 2015-06-10 DIAGNOSIS — Z961 Presence of intraocular lens: Secondary | ICD-10-CM | POA: Insufficient documentation

## 2015-06-10 DIAGNOSIS — Q1389 Other congenital malformations of anterior segment of eye: Secondary | ICD-10-CM

## 2015-06-10 DIAGNOSIS — Q134 Other congenital corneal malformations: Secondary | ICD-10-CM

## 2015-06-10 DIAGNOSIS — Z9842 Cataract extraction status, left eye: Secondary | ICD-10-CM | POA: Insufficient documentation

## 2015-06-10 NOTE — Progress Notes (Addendum)
[place]  Operated by [facility]  [place] [address]  Dept: [phone]    Patient Name: Frederick Leon  MRN#: [id number]  [place]: 1986/01/15    Date of Service: 06/10/2015    Chief Complaint     Eye Exam; Follow Up Cornea Problem          Frederick Leon is a 30 y.o. male who presents today for evaluation/consultation of:  HPI     Hx: HX:CE c IOL OU.  Pseudophakia OU. Hx amblyopia   PDRTC 1 year Exam   Doing  well, Vision OU, D/N, CC, seeming unchanged since last visit 05/09/2014  Denies Pain/ Discomfort OU  Denies Curtain / [place] OU   Denies Flashes or Floaters OU   Denies using gtts/Ung OU   Denies Changes in glare OU           Last edited by Robley Fries, COA on 06/10/2015  3:24 PM.     ROS     Positive for: Eyes ( Hx: HX:CE c IOL OU.  Pseudophakia OU. Hx amblyopia )    Last edited by Robley Fries, [place] on 06/10/2015  3:24 PM. (History)           Robley Fries, [place] 06/10/2015, 15:44     MD Addition to HPI: 30 yo male here for yearly exam. Pt states stable vision. States no pain, irritation or flashes.     Past Surgical History   Procedure Laterality Date    Hx strabismus surgery  1989     bil rectus muscle eye surgery    Hx cataract removal  02/19/08     bilateral    Hx eye surgery  7/85      corneal rotation right eye .            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    [name]     Strabismus            Patient Active Problem List   Diagnosis    [name]    Strabismus    Amblyopia of right eye    Cornea scar    Cataract       Family History:  Family History   Problem Relation Age of Onset    Healthy Mother     Healthy Father            Social History:     Social History   Substance Use Topics    Smoking status: Never Smoker    Smokeless tobacco: Never Used    Alcohol use Yes      Comment: socially               My Assessment/Ophthalmic Plan of Care:  S/p CE IOL OU - implant in good position   Stable retina exam  IOP 15 and 16 - within normal limits   Updated MRx given           Documented chief complaint, history of present illness, allergies, review of systems, past medical, past surgical, family and social history were reviewed by me and when necessary I made changes to the technician note.  I also reviewed and agree with the findings documented in ophthalmology exam tab. I discussed the  above diagnoses listed in the assessment and the above ophthalmic plan of care with the patient and patient's family. All questions were answered.      No orders of the defined types were placed in this encounter.      No orders of the defined types were placed in this encounter.      I scribed a portion of the encounter including Chief Complaint, HPI, Impression, Plan, and exam elements excluding visual acuity, lensometry, pupil assessment, confrontational visual fields, tonometry results, extraocular motility assessment, refractometry results, and assessment of mood and orientation. I scribed this note at the request of the physician who personally performed the services documented.  Michaele Offer, SCRIBE 06/10/2015, 15:46    I have reviewed and confirmed the ROS, PFSH, and exam elements performed and documented by the technician. The scribed portion of the progress note was scribed on my behalf and at my direction. I have reviewed and attest to the accuracy of the note.   Dennis Bast, MD 06/10/2015, 16:00

## 2016-06-10 ENCOUNTER — Ambulatory Visit: Payer: BC Managed Care – PPO | Attending: Ophthalmology | Admitting: Ophthalmology

## 2016-06-10 ENCOUNTER — Encounter (INDEPENDENT_AMBULATORY_CARE_PROVIDER_SITE_OTHER): Payer: Self-pay | Admitting: Ophthalmology

## 2016-06-10 DIAGNOSIS — Z947 Corneal transplant status: Secondary | ICD-10-CM | POA: Insufficient documentation

## 2016-06-10 DIAGNOSIS — H527 Unspecified disorder of refraction: Secondary | ICD-10-CM | POA: Insufficient documentation

## 2016-06-10 DIAGNOSIS — H53001 Unspecified amblyopia, right eye: Secondary | ICD-10-CM

## 2016-06-10 DIAGNOSIS — H509 Unspecified strabismus: Secondary | ICD-10-CM

## 2016-06-10 DIAGNOSIS — Q1389 Other congenital malformations of anterior segment of eye: Secondary | ICD-10-CM

## 2016-06-10 DIAGNOSIS — H179 Unspecified corneal scar and opacity: Secondary | ICD-10-CM

## 2016-06-10 DIAGNOSIS — Z973 Presence of spectacles and contact lenses: Secondary | ICD-10-CM | POA: Insufficient documentation

## 2016-06-10 DIAGNOSIS — Q134 Other congenital corneal malformations: Secondary | ICD-10-CM

## 2016-06-10 NOTE — Progress Notes (Addendum)
[place]  Operated by [facility]  [place] [address]  Dept: [phone]    Patient Name: Frederick Leon  MRN#: [id number]  [place]: 19-Nov-1985    Date of Service: 06/10/2016    Chief Complaint     Eye Problem          Frederick Leon is a 31 y.o. male who presents today for evaluation/consultation of:  HPI     Pt here for yearly corneal exam. Pt states he would like new glasses Rx today.   Pt has not noticed any changes in vision since last visit.   Pt has history of CE IOL OU           Last edited by Nelly Laurence, COA on 06/10/2016  2:09 PM.     ROS     Positive for: Eyes    Negative for: Constitutional, Gastrointestinal, Neurological, Skin, Genitourinary, Musculoskeletal, HENT, Endocrine, Cardiovascular, Respiratory, Psychiatric, Allergic/Imm, Heme/Lymph    Last edited by Nelly Laurence, COA on 06/10/2016  2:09 PM. (History)           Nelly Laurence, COA 06/10/2016, 14:30    MD Addition to HPI: 31 yo male here for yearly exam. Pt states he is doing well with no pain or discomfort. Notes vision is stable with no changes. Hx CE IOL OU     Past Surgical History:   Procedure Laterality Date   . HX CATARACT REMOVAL  02/19/08    bilateral   . HX EYE SURGERY  7/85     corneal rotation right eye .    Marland Kitchen HX STRABISMUS SURGERY  1989    bil rectus muscle eye surgery           Past Medical History:   Diagnosis Date   . Amblyopia of right eye    . Cataract     bilateral    . 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 [name]    . Strabismus            Patient Active Problem List   Diagnosis   . [name]   . Strabismus   . Amblyopia of right eye   . Cornea scar   . Cataract       Family History:  Family Medical History     Problem Relation (Age of Onset)    Healthy Mother, Father              Social History:     Social History   Substance Use Topics   . Smoking status: Never Smoker   . Smokeless tobacco: Never Used   . Alcohol use Yes       Comment: socially            My Assessment/Ophthalmic Plan of Care:   1) S/p CE IOL OU   - implant in good position   - Stable retina exam    2) Refractive error  - New MRx given  IOP 16 and 10 - WNL  RTC 1 year           Documented chief complaint, history of present illness, allergies, review of systems, past medical, past surgical, family and social history were reviewed by me and when necessary I made changes to the technician note.  I also reviewed and agree with the findings documented in ophthalmology exam tab. I discussed the above diagnoses listed  in the assessment and the above ophthalmic plan of care with the patient and patient's family. All questions were answered.      No orders of the defined types were placed in this encounter.      No orders of the defined types were placed in this encounter.      I am scribing for, and in the presence of, Dr. Dennis Bast for services provided on 06/10/2016.  Michaele Offer, SCRIBE     Michaele Offer, SCRIBE 06/10/2016, 14:32    I have reviewed and confirmed the ROS, PFSH, and exam elements performed and documented by the technician. The scribed portion of the progress note was scribed on my behalf and at my direction. I have reviewed and attest to the accuracy of the note.   Dennis Bast, MD 06/10/2016, 14:42\

## 2017-06-07 ENCOUNTER — Ambulatory Visit: Payer: BC Managed Care – PPO | Attending: DERMATOLOGY | Admitting: Dermatology

## 2017-06-07 VITALS — Temp 98.1°F | Ht 71.46 in | Wt 214.5 lb

## 2017-06-07 DIAGNOSIS — D225 Melanocytic nevi of trunk: Secondary | ICD-10-CM | POA: Insufficient documentation

## 2017-06-07 DIAGNOSIS — Z808 Family history of malignant neoplasm of other organs or systems: Secondary | ICD-10-CM | POA: Insufficient documentation

## 2017-06-07 DIAGNOSIS — D229 Melanocytic nevi, unspecified: Secondary | ICD-10-CM

## 2017-06-07 NOTE — Progress Notes (Signed)
Dermatology Clinic, [facility]  [place] [address]  [phone]    Date:   06/07/2017  Name: Frederick Leon  Age: 32 y.o.    Chief complaint: Skin Check    HPI  32 year old male who presents to the clinic as a new patient for further evaluation of upper extremity and back lesions. Girlfriend first noticed the lesions about 1 month ago. No associated pain. No bleeding. Not pruritic. No personal history of skin cancer. Does not recall nevus growing in size or changing. Positive for family history of non melanoma skin cancer (grandfather). Patient endorses using sunscreen.     Review of Systems   Constitutional: Negative for chills and fever.   Skin: Negative for itching and rash.     Current Medications  No current outpatient medications on file.     Allergies   Allergen Reactions   . Ardine Bjork Known Drug Allergies]      Past Medical History:   Diagnosis Date   . Amblyopia of right eye    . Cataract     bilateral    . 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 [name]    . Strabismus          Physical Exam  Vitals: Temperature 36.7 C (98.1 F), temperature source Temporal, height 1.815 m (5' 11.46"), weight 97.3 kg (214 lb 8.1 oz).  Physical Exam   Constitutional: He appears well-developed and well-nourished.   Skin: Skin is warm and dry.          Assessment and Plan    1. Nevus  - appears benign, reassured patient, no further intervention  - .Pt was educated on the ABCDE's of Melanoma:  Assymetry, Border irregularity, Color variation, Diameter (>63mm), Evolution of lesion. We discussed importance of photoprotection and self-evaluation for melanoma and NMSC.   - .The patient was educated on the importance of avoiding excessive sun exposure and wearing sunscreen daily.  Advised patient to re-apply sunscreen every 2-3 hours.  Advised the patient to avoid going to the tanning beds.  Advised to check skin routinely for any changes,  especially any new moles or changes in existing moles.    Follow up in 1 year    .Cecile Sheerer, MD  PGY-2 Resident Physician   Department of Dermatology       I saw and examined the patient.  I reviewed the resident's note.  I agree with the findings and plan of care as documented in the resident's note.  Any exceptions/additions are edited/noted.    Dellia Cloud, MD

## 2017-08-18 ENCOUNTER — Ambulatory Visit: Payer: BC Managed Care – PPO | Attending: Ophthalmology | Admitting: Ophthalmology

## 2017-08-18 DIAGNOSIS — H527 Unspecified disorder of refraction: Secondary | ICD-10-CM | POA: Insufficient documentation

## 2017-08-18 DIAGNOSIS — Q1389 Other congenital malformations of anterior segment of eye: Secondary | ICD-10-CM

## 2017-08-18 DIAGNOSIS — Q134 Other congenital corneal malformations: Secondary | ICD-10-CM

## 2017-08-18 DIAGNOSIS — Z961 Presence of intraocular lens: Secondary | ICD-10-CM | POA: Insufficient documentation

## 2017-08-18 NOTE — Progress Notes (Addendum)
[place]  Operated by [facility]  [place] [address]  Dept: [phone]    Patient Name: Frederick Leon  MRN#: [id number]  [place]: Oct 09, 1985    Date of Service: 08/18/2017    Chief Complaint     Follow Up Cornea Problem          Frederick Leon is a 32 y.o. male who presents today for evaluation/consultation of:  HPI     Hx: [name] / Pseudo OU /     PDRTC:  1 year Exam OU   Doing well, Vision OU , CC, D/N, seeming to be unchanged over past 1 year.  Denis Vision issues known OU   Denies Itching / Burning OU   Denies Discharge OU   Denies using gtts/Ung   Denies Flashes or Floaters or Visual Distortion changes       Last edited by Robley Fries, [place] on 08/18/2017  2:46 PM. (History)        ROS     Positive for: Eyes ( Hx: [name] / Pseudo OU )    Negative for: Endocrine, Cardiovascular, Heme/Lymph    Last edited by Robley Fries, COA on 08/18/2017  2:46 PM. (History)           Robley Fries, [place] 08/18/2017, 15:09     MD Addition to HPI: 32 yo male here for yearly exam. Pt states he is doing well with no pain or discomfort. Vision is stable. He is not currently on any gtts.     Past Surgical History:   Procedure Laterality Date   . HX CATARACT REMOVAL  02/19/08    bilateral   . HX EYE SURGERY  7/85     corneal rotation right eye .    Marland Kitchen HX STRABISMUS SURGERY  1989    bil rectus muscle eye surgery           Past Medical History:   Diagnosis Date   . Amblyopia of right eye    . Cataract     bilateral    . 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 [name]    . Strabismus            Patient Active Problem List   Diagnosis   . [name]   . Strabismus   . Amblyopia of right eye   . Cornea scar   . Cataract       Family History:  Family Medical History:     Problem Relation (Age of Onset)    Healthy Mother, Father              Social History:     Social History     Tobacco Use   . Smoking status: Never Smoker   .  Smokeless tobacco: Never Used   Substance Use Topics   . Alcohol use: Yes     Comment: socially            My Assessment/Ophthalmic Plan of Care:   1) S/p CE IOL OU   - implant in good position OU  - retinal exam stable     2) Refractive error  - new mRx given  RTC 1 year            Documented chief complaint, history of present illness, allergies, review of systems, past medical, past surgical, family and social history were reviewed by me and when necessary  I made changes to the technician note.  I also reviewed and agree with the findings documented in ophthalmology exam tab. I discussed the above diagnoses listed in the assessment and the above ophthalmic plan of care with the patient and patient's family. All questions were answered.      No orders of the defined types were placed in this encounter.      No orders of the defined types were placed in this encounter.      I am scribing for, and in the presence of, Dr. Dennis Bast for services provided on 08/18/2017.  Michaele Offer, SCRIBE     Michaele Offer, [place] 08/18/2017, 15:09    I have reviewed and confirmed the ROS, PFSH, and exam elements performed and documented by the technician. The scribed portion of the progress note was scribed on my behalf and at my direction. I have reviewed and attest to the accuracy of the note.   Dennis Bast, MD 08/18/2017, 15:22

## 2018-01-28 ENCOUNTER — Emergency Department (HOSPITAL_COMMUNITY): Payer: BC Managed Care – PPO

## 2018-01-28 ENCOUNTER — Emergency Department
Admission: EM | Admit: 2018-01-28 | Discharge: 2018-01-28 | Disposition: A | Discharge status: Home / Self Care | Payer: BC Managed Care – PPO | Attending: Emergency Medicine | Admitting: Emergency Medicine

## 2018-01-28 ENCOUNTER — Encounter (HOSPITAL_COMMUNITY): Payer: Self-pay

## 2018-01-28 ENCOUNTER — Emergency Department (EMERGENCY_DEPARTMENT_HOSPITAL): Payer: BC Managed Care – PPO

## 2018-01-28 DIAGNOSIS — S0081XA Abrasion of other part of head, initial encounter: Secondary | ICD-10-CM | POA: Insufficient documentation

## 2018-01-28 DIAGNOSIS — R1011 Right upper quadrant pain: Secondary | ICD-10-CM | POA: Insufficient documentation

## 2018-01-28 DIAGNOSIS — R55 Syncope and collapse: Secondary | ICD-10-CM

## 2018-01-28 DIAGNOSIS — W19XXXA Unspecified fall, initial encounter: Secondary | ICD-10-CM | POA: Insufficient documentation

## 2018-01-28 DIAGNOSIS — S0992XA Unspecified injury of nose, initial encounter: Secondary | ICD-10-CM

## 2018-01-28 DIAGNOSIS — W228XXA Striking against or struck by other objects, initial encounter: Secondary | ICD-10-CM | POA: Insufficient documentation

## 2018-01-28 DIAGNOSIS — S0990XA Unspecified injury of head, initial encounter: Secondary | ICD-10-CM

## 2018-01-28 DIAGNOSIS — R001 Bradycardia, unspecified: Secondary | ICD-10-CM

## 2018-01-28 LAB — CBC WITH DIFF
BASOPHIL #: <0.1 x10ˆ3/uL (ref <=0.20)
BASOPHIL %: 0 %
EOSINOPHIL #: <0.1 x10ˆ3/uL (ref <=0.50)
EOSINOPHIL %: 0 %
HCT: 43.7 % (ref 38.9–52.0)
HGB: 15 g/dL (ref 13.4–17.5)
IMMATURE GRANULOCYTE #: <0.1 x10ˆ3/uL (ref <0.10)
IMMATURE GRANULOCYTE %: 1 % (ref 0–1)
LYMPHOCYTE #: 1.16 x10ˆ3/uL (ref 1.00–4.80)
LYMPHOCYTE %: 14 %
MCH: 30.1 pg (ref 26.0–32.0)
MCHC: 34.3 g/dL (ref 31.0–35.5)
MCV: 87.8 fL (ref 78.0–100.0)
MONOCYTE #: 0.81 x10ˆ3/uL (ref 0.20–1.10)
MONOCYTE %: 10 %
MPV: 8.6 fL — ABNORMAL LOW (ref 8.7–12.5)
NEUTROPHIL #: 5.99 x10ˆ3/uL (ref 1.50–7.70)
NEUTROPHIL %: 75 %
PLATELETS: 337 x10ˆ3/uL (ref 150–400)
RBC: 4.98 x10ˆ6/uL (ref 4.50–6.10)
RDW-CV: 12.5 % (ref 11.5–15.5)
WBC: 8.1 x10ˆ3/uL (ref 3.7–11.0)

## 2018-01-28 LAB — BASIC METABOLIC PANEL
ANION GAP: 7 mmol/L (ref 4–13)
BUN/CREA RATIO: 17 (ref 6–22)
BUN: 17 mg/dL (ref 8–25)
CALCIUM: 9.1 mg/dL (ref 8.5–10.2)
CHLORIDE: 105 mmol/L (ref 96–111)
CO2 TOTAL: 27 mmol/L (ref 22–32)
CREATININE: 1.01 mg/dL (ref 0.62–1.27)
ESTIMATED GFR: >59 mL/min/1.73mˆ2 (ref >59)
GLUCOSE: 100 mg/dL (ref 65–139)
POTASSIUM: 4.9 mmol/L (ref 3.5–5.1)
SODIUM: 139 mmol/L (ref 136–145)

## 2018-01-28 LAB — HEPATIC FUNCTION PANEL
ALBUMIN: 4 g/dL (ref 3.5–5.0)
ALKALINE PHOSPHATASE: 90 U/L (ref 45–115)
ALT (SGPT): 37 U/L (ref <55)
AST (SGOT): 23 U/L (ref 8–48)
BILIRUBIN DIRECT: 0.3 mg/dL — ABNORMAL HIGH (ref <0.3)
BILIRUBIN TOTAL: 0.7 mg/dL (ref 0.3–1.3)
PROTEIN TOTAL: 7.6 g/dL (ref 6.4–8.3)

## 2018-01-28 LAB — ECG 12-LEAD
Atrial Rate: 47 {beats}/min
Calculated P Axis: 13 degrees
Calculated R Axis: 52 degrees
Calculated T Axis: 38 degrees
PR Interval: 202 ms
QRS Duration: 96 ms
QT Interval: 448 ms
QTC Calculation: 396 ms
Ventricular rate: 47 {beats}/min

## 2018-01-28 LAB — GAMMA GT: GGT: 60 U/L — ABNORMAL HIGH (ref 7–50)

## 2018-01-28 MED ORDER — SODIUM CHLORIDE 0.9 % IV BOLUS
1000.0000 mL | INJECTION | Freq: Once | Status: AC
Start: 2018-01-28 — End: 2018-01-28
  Administered 2018-01-28: 1000 mL via INTRAVENOUS
  Administered 2018-01-28: 0 mL via INTRAVENOUS

## 2018-01-28 NOTE — ED Nurses Note (Signed)
Pt presents to the ED stating that he had a syncopal episode while sitting in a chair waiting to get a haircut. Pt states he fell forward striking his face on cabinet. Skin tear with mild swelling noted to bridge of nose. Pt states before he passed out, he had experienced RUQ pain. Pt states that pain is mild at this time. Pt states he has some nausea as well. Denies passing out in the past. EKG at bedside. Call bell within reach. Pt given blanket, awaiting provider.

## 2018-01-28 NOTE — Discharge Instructions (Signed)
General Instructions:  You are considered stable for discharge from the emergency department. Please carefully follow all the instructions you were given verbally as well as the written instructions given below. In general, immediately return to the emergency department if the symptoms you presented with today increase in severity, change in any way, and/or do not improve in what you consider an acceptable time frame.  Return if you develop fever >101, vomiting, oral liquid intolerance, chest pain, shortness of breath, weakness, change in behavior, or any other concerns.    Medication(s) Instructions (if applicable):  If you were given any medication(s) upon discharge, please strictly follow the directions as prescribed for taking the medication(s). Should you feel you develop any type of reaction to the medication(s), including, but not limited to, rash, swelling of the lips or face, or difficulty breathing, immediately discontinue the use of the medication(s) and seek prompt medical care. Please read the medication(s) insert provided by the pharmacy and follow all guidelines and recommendations. Please take all medications as prescribed for your symptoms or diagnoses.  New Prescriptions    No medications on file                   Follow-Up Instructions:  If you were given instructions to follow-up with a health care provider upon discharge, please be sure to do so.  It is your responsibility to call and/or make an appointment with the health care providers listed on your discharge papers and/or your primary care provider in the appropriate time frame given.  Please take a copy of your discharge papers with you to your follow-up appointment(s). Please follow up with your primary care physician in 7 days or earlier if needed for your symptoms. Please follow up with any specialist provider in clinic that your were given instructions to see as an outpatient.     YOU MUST CALL THE NUMBER LISTED ON THE DISCHARGE  PAPERWORK TO CONFIRM YOUR APPOINTMENT.    Special Information / Instructions:  Please read and follow all attached discharge instructions.        Please call the [facility] at [phone] with any questions or concerns.    Please return to the Emergency Department if you have persistence or worsening of your symptoms.    Thank you for allowing me to take part in your care.    Lynise Porr, MD  [facility]  Emergency Medicine Resident

## 2018-01-28 NOTE — ED Nurses Note (Signed)
Report given to oncoming RN. Pt care transferred.

## 2018-01-28 NOTE — ED Nurses Note (Signed)
20g PIV placed in L AC. Blood return present, flushed for patency without difficulty. Labs obtained/sent. Patient resting, without complaints, vital signs stable, warm blanket provided. Call bell placed within reach.Will continue to monitor.

## 2018-01-28 NOTE — ED Provider Notes (Signed)
Emergency Department  Provider Note    Name: Frederick Leon  Age and Gender: 32 y.o. male  PCP: No Pcp  Attending: Dr. Audelia Hives    Triage Note:   Syncope (Pt reports he was sitting getting his hair cut when he fell forward striking his head on a cabinet. Abrasion present to forehead at midline, bridge of nose is red and swollen. He reports he experienced a sharp pain in the RUQ right before it happened. )    Clinical Impression:     Encounter Diagnosis   Name Primary?   . Syncope and collapse Yes     Course and MDM:  Patient seen and examined. Labs and imaging reviewed.  Frederick Leon is a 32 y.o. male       Patient presents after an episode of syncope and collapse from chair while getting his hair cut.  Patient denies any prodromal symptoms, had loss of consciousness.  Not on any blood thinners.  No headache now.  Had lengthy conversation with patient and significant other who was at bedside, will give patient 1 liter normal saline bolus and will get basic labs.  We will obtain CT of head after discussion with patient to evaluate for trauma and small possibility of intracranial mass.   Labs largely unremarkable aside from slightly elevated conjugated bilirubin at 0.3 and gamma GT of 60.   EKG with normal sinus rhythm, slight bradycardia but patient is athletic and this is likely his baseline.   CT head negative for intracranial abnormality.   Results discussed with patient and patient continues to feel well, he agrees with discharge at this time.  Patient does not have PCP, referral given for patient to establish with PCP at [facility].  Following the above history, physical exam, and studies, the patient was deemed stable and suitable for discharge. The patient was advised to return to the ED for any new or worsening symptoms. Discharge, medication and follow up instructions were discussed with the patient, who verbalized understanding. The patient is comfortable with the  plan of care.    Disposition: Discharged    Follow up:   Internal Medicine, [facility]  [place] [id number]  [phone]        --------------------------------------------------------------------------------------------------------------------------------  HPI:  Frederick Leon is a 32 y.o. male  who presents to the ED today for syncope.    Patient states he was getting his hair cut today when he had onset of lightheadedness and R flank pain. States he started "dreaming" and then woke up on the floor. States he hit his head on the cabinet in front of him. Drank a bottle of water after the syncopal episode, lightheadedness persisted. States he has had similar symptoms in the past when having blood drawn. Girlfriend in room reports patient had fever illness 1.5 weeks ago, had lab work done at this time and liver enzymes were slightly elevated, otherwise has been well since that time. Denies vomiting, diarrhea, changes in PO intake, SOB, urinary incontinence, headache, and any other associated sx or complaints at this time. PMH [name], strabismus. NKDA.    Review of Systems:  ROS  Text in bold indicates (+) findings; all other findings (-)  Constitutional: fever, chills, weakness   Skin: rash, diaphoresis  HENT: headaches, congestion  Eyes: vision changes, photophobia  Cardio: chest pain, palpitations, leg swelling  Respiratory: cough, wheezing, SOB  GI:  nausea, vomiting, diarrhea, constipation, abdominal  pain  GU:  dysuria, hematuria, increased frequency  MSK: muscle aches, joint, back pain  Neuro: seizures, confusion, LOC, numbness, tingling, focal weakness, lightheadedness  All other systems reviewed and are negative.    Below information reviewed with patient:  Past Medical History:   Diagnosis Date   . Amblyopia of right eye    . Cataract     bilateral    . 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 Peter's  anomaly    . Strabismus          No current outpatient medications on file.     Allergies   Allergen Reactions   . Ardine Bjork Known Drug Allergies]      Past Surgical History:   Procedure Laterality Date   . HX CATARACT REMOVAL  02/19/08    bilateral   . HX EYE SURGERY  7/85     corneal rotation right eye .    Marland Kitchen HX STRABISMUS SURGERY  1989    bil rectus muscle eye surgery         Social History     Occupational History     Employer: [employer]   Tobacco Use   . Smoking status: Never Smoker   . Smokeless tobacco: Never Used   Substance and Sexual Activity   . Alcohol use: Yes     Comment: socially   . Drug use: No   . Sexual activity: Not on file       Objective:  Nursing notes reviewed    ED Triage Vitals [01/28/18 1125]   Enc Vitals Group      BP (Non-Invasive) 110/66      Heart Rate 56      Respiratory Rate 16      Temperature 36.5 C (97.7 F)      SpO2 97 %      Weight 94.1 kg (207 lb 7.3 oz)      Height 1.829 m (6')     Filed Vitals:    01/28/18 1125 01/28/18 1321 01/28/18 1400   BP: 110/66 111/67 106/62   Pulse: 56 56 58   Resp: 16  16   Temp: 36.5 C (97.7 F) 37.1 C (98.7 F)    SpO2: 97% 95% 97%       Physical Exam  Physical Exam    Constitutional: Pleasant 32 y.o. male appears stated age in good health, normal color, no cyanosis. Resting in bed in no acute distress  HENT:   Head: Normocephalic. Abrasion over mid forehead and nasal bridge   Mouth/Throat: Oropharynx is clear and moist.   Eyes: EOMI, PERRL , conjunctivae without discharge bilaterally  Neck: Trachea midline. Neck supple.  Cardiovascular: RRR, No murmurs, rubs or gallops. Intact distal pulses.  Pulmonary/Chest: Breath sounds equal bilaterally. No respiratory distress. No wheezes, rales, or chest tenderness.   Abdominal: BS +. Abdomen soft, nontender, no rebound or guarding.  Back: No midline spinal tenderness, no paraspinal tenderness, no CVA tenderness.           Musculoskeletal: No edema, tenderness or deformity.  Skin: Warm and  dry. No rash, erythema, pallor or cyanosis  Psychiatric: Normal mood and affect. Behavior is normal.   Neurological: Patient alert and responsive, CN II-XII grossly intact, moving all extremities equally and fully    Labs:   Labs Reviewed   HEPATIC FUNCTION PANEL - Abnormal; Notable for the following components:  Result Value    BILIRUBIN DIRECT 0.3 (*)     All other components within normal limits   GAMMA GT - Abnormal; Notable for the following components:    GGT 60 (*)     All other components within normal limits   CBC WITH DIFF - Abnormal; Notable for the following components:    MPV 8.6 (*)     All other components within normal limits   BASIC METABOLIC PANEL - Normal   CBC/DIFF    Narrative:     The following orders were created for panel order CBC/DIFF.  Procedure                               Abnormality         Status                     ---------                               -----------         ------                     CBC WITH RKYH[[id number]]                 Abnormal            Final result                 Please view results for these tests on the individual orders.       Imaging:    Results for orders placed or performed during the hospital encounter of 01/28/18   CT BRAIN WO IV CONTRAST     Status: None (Preliminary result)    Narrative    CT BRAIN WO IV CONTRAST performed on 01/28/2018 12:53 PM    INDICATION: 32 years old Male; syncope and fall, forehead/nose trauma     TECHNIQUE: 5 mm Axial images of the brain from the vertex to the skull base  with reformatted 5 mm coronal and sagittal images reconstructed with soft  tissue and bone algorithms    RADIATION DOSE: 1286.30 mGycm    COMPARISON: None available    FINDINGS: There is no acute intracranial hemorrhage or extra-axial fluid  collection. There is no mass or midline shift. The ventricles are normal in  size and symmetry. There is no evidence of hydrocephalus. The gray-white  differentiation is maintained.    The scalp and skull are normal.  Mucous retention cysts are noted within the  right sphenoid and left ethmoid sinuses otherwise, bilateral paranasal  sinuses and mastoid air cells are well aerated. There is bilateral  pseudophakia.        Impression    No acute intracranial abnormality.         EKG:  Sinus rhythm, normal axis, normal intervals, no signs of acute ischemia or infarction.     Orders:  Orders Placed This Encounter   . CT BRAIN WO IV CONTRAST   . CBC/DIFF   . BASIC METABOLIC PANEL, NON-FASTING   . HEPATIC FUNCTION PANEL   . GAMMA GT   . CBC WITH DIFF   . ECG 12-LEAD   . SCHEDULE FOLLOW-UP MEDICINE ([facility]) - [facility]   . NS bolus infusion 1,000 mL       I am scribing  for, and in the presence of, Dr. Junie Bame for services provided on 01/28/2018  Teresita Madura, SCRIBE    // Teresita Madura, [place]  01/28/2018, 11:48    I personally performed the services described in this documentation, as scribed in my presence, and it is both accurate and complete.    Junie Bame, MD 01/28/2018, 11:41   PGY-2 Emergency Medicine  [facility]  Pager # - [place] [facility]    *Parts of this patients chart were completed in a retrospective fashion due to simultaneous direct patient care activities in the Emergency Department.   *This note was partially generated using MModal Fluency Direct system, and there may be some incorrect words, spellings, and punctuation that were not noted in checking the note before saving.

## 2018-02-06 ENCOUNTER — Encounter (INDEPENDENT_AMBULATORY_CARE_PROVIDER_SITE_OTHER): Payer: Self-pay | Admitting: GENERAL

## 2018-02-06 ENCOUNTER — Ambulatory Visit: Payer: BC Managed Care – PPO | Attending: Internal Medicine | Admitting: GENERAL

## 2018-02-06 VITALS — BP 124/75 | HR 76 | Temp 98.2°F | Ht 72.0 in | Wt 212.3 lb

## 2018-02-06 DIAGNOSIS — Z6828 Body mass index (BMI) 28.0-28.9, adult: Secondary | ICD-10-CM

## 2018-02-06 DIAGNOSIS — Z23 Encounter for immunization: Secondary | ICD-10-CM | POA: Insufficient documentation

## 2018-02-06 DIAGNOSIS — Q134 Other congenital corneal malformations: Secondary | ICD-10-CM | POA: Insufficient documentation

## 2018-02-06 DIAGNOSIS — R55 Syncope and collapse: Secondary | ICD-10-CM

## 2018-02-06 DIAGNOSIS — R1011 Right upper quadrant pain: Secondary | ICD-10-CM | POA: Insufficient documentation

## 2018-02-06 DIAGNOSIS — Z0000 Encounter for general adult medical examination without abnormal findings: Principal | ICD-10-CM | POA: Insufficient documentation

## 2018-02-06 DIAGNOSIS — Z7689 Persons encountering health services in other specified circumstances: Secondary | ICD-10-CM

## 2018-02-06 NOTE — Progress Notes (Signed)
[facility]  Operated by [address], [place] [address]      MEDICINE HISTORY & PHYSICAL    PATIENT NAME: Frederick Leon  MRN: [id number]  DOB: [birth year]   DATE OF SERVICE: 02/06/2018    Chief Complaint:  Establish care     HPI:  Frederick Leon is a 32 y.o. with history of [name] (follows with [facility]) who presents to [facility] today to establish care, and for ED follow up. He was seen in [facility] ED 01/28/18 after an episode of syncope which occurred while he was getting his hair cut; he describes developing sharp RUQ pain which then led him to either hold his breath or have a vasovagal response, he's not sure which. He fell forwards and thinks he was only "dreaming" for a few seconds before waking up on the ground. He immediately felt better and finished his hair cut before being taken to the ED by his girlfriend. CT head without contrast in the ED was negative for acute process and labs (CBC, BMP, LFTs) were largely WNL. No other history of syncope but does say he'll often have vasovagal responses to things like shots. No headache, blurred vision, N/V, weakness, or presyncope/syncope since the event.   Prior to this episode he had a febrile illness lasting just a couple days but had labs done at [facility]; those revealed very slightly elevated LFTs. Labs done in the ED show slightly elevated GGT at 60. He has not had further abdominal pain but has had several episodes of possible heartburn in the past after eating red meat. No known family history of liver or biliary disease.       Review of Systems (positive if BOLD):   General: Fevers, chills, weight change   HEENT: Vision change, hearing change, rhinorrhea, congestion, difficulty swallowing    Respiratory: Shortness of breath, cough, wheezing   Cardiac: Chest pain, palpitations, LE edema   Gastrointestinal: Abdominal pain (very occasional), N/V/D, melena/hematochezia  Genitourinary:  Dysuria, hematuria  Neurological: HA, dizziness, paresthesias, focal neurological deficits, syncope x1 (per HPI)    Musculoskeletal: Joint pain, back pain   Psych: Depression, anxiety, memory changes     Past Medical History:  Patient Active Problem List   Diagnosis   . [name]   . Strabismus   . Amblyopia of right eye   . Cornea scar   . Cataract     Surgeries:  Past Surgical History:   Procedure Laterality Date   . HX CATARACT REMOVAL  02/19/08    bilateral   . HX EYE SURGERY  7/85     corneal rotation right eye .    Marland Kitchen HX STRABISMUS SURGERY  1989    bil rectus muscle eye surgery     Medication:  No outpatient medications have been marked as taking for the 02/06/18 encounter (Office Visit) with Beverly Gust, MD.     Allergies:  Allergies   Allergen Reactions   . Ardine Bjork Known Drug Allergies]      Social History:  Social History     Social History Narrative   . Not on file       OBJECTIVE:  BP 124/75   Pulse 76   Temp 36.8 C (98.2 F) (Thermal Scan)   Ht 1.829 m (6')   Wt 96.3 kg (212 lb 4.9 oz)   SpO2 98%   BMI 28.79 kg/m  Physical Exam:  Frederick Leon during assessment was pleasant and cooperative  General: No acute distress, VS as above  HENT: AT/NC, MMM. R corneal clouding w/scar  NECK: Supple, trachea midline, no thyromegaly   Lungs: CTA bilaterally with no crackles or wheezes  Cardiovascular: RRR, no murmur appreciated   Abdomen: Soft, non-tender and non-distended, BS normal  Extremities: No cyanosis or edema noted   Skin: Warm, dry, no rashes. Small abrasion on bridge of nose  Neurological: AOx3, CN II-XII grossly intact, gait normal     Psychiatric: Mood and affect appropriate      ASSESSMENT AND PLAN:  Frederick Leon is a 32 y.o. with history of [name] (follows with [facility]) who presents to [facility] today to establish care.    Syncope x1:  - Suspect vasovagal etiology; patient did have prodromal Sx and has no family Hx dysrhythmias, so lower suspicion  for cardiac etiology    - CT negative. No residual S/S including HA, dizziness, blurred vision, focal neurological signs  - Advised good PO hydration    RUQ pain, infrequent:  - Symptomatic cholelithiasis vs reflux; LFTs not consistent with hepatic injury    - No family history of hepatobiliary disease  - No further workup at this time; encouraged to return to clinic if symptoms becoming more frequent or severe and will order RUQ [place]       Health Maintenance reviewed - yes.  - Fasting glucose: Done at [facility]-- will fax or bring records with him to next appt   - Lipids: Done at [facility]-- will fax or bring records with him to next appt   - Tobacco: Never user  - Depression screen/PHQ-2: Negative   - HIV screen: Ordered today   - Colonoscopy (every 10 years >50 years): N/A   - DEXA (>65 years or risk factors): N/A  - ASA (81 mg; ischemic stroke prevention in women 55-79, cardioprotection in men >45): N/A  - Immunizations:       - Tdap (every 10 years): Given today in clinic       - Annual flu shot: Not yet due       - Pneumococcal (>65 years or risk factors): N/A      - Zoster (>60 years): N/A     Health Maintenance Topics with due status: Overdue       Topic Date Due    Depression Screening 10/21/1995    HIV Screening 10/21/1998    Adult Tdap-Td 10/21/2002     Health Maintenance Topics with due status: Due On       Topic Date Due    Influenza Vaccine 01/22/2018     Orders Placed This Encounter   . Tdap Vaccine - Boostrix - >=10 yrs (Admin)   . HIV1/HIV2 SCREEN, COMBINED ANTIGEN AND ANTIBODY     Return to clinic in 1 year or sooner if needed.       Daphane Shepherd, MD 02/06/2018, 00:12  Internal Medicine/Pediatrics, PGY-3  [facility]    ________________________________________________________________________    I discussed the patient's care with the Resident prior to the patient leaving the clinic. Any significant discussion points are noted.      Pietro Cassis Erick Blinks, MD  Assistant Professor, Internal  Medicine/Pediatrics  [facility]

## 2018-02-11 NOTE — ED Attending Note (Signed)
I was physically present and directly supervised this patient's care.  Patient seen and examined.  Resident / Aron Baba / NP history and exam reviewed.   Key elements in addition to and/or correction of that documentation are as follows:    HPI :    32 y.o. y.o. male presents with chief complaint of LOC while getting hair cut, struck nose on cabinet, no seizure like activity, brief and resolved, back to baseline quickly, no motor deficits, no chest pain or SOB.    PE :   VS on presentation: Temperature: 36.5 C (97.7 F)  Heart Rate: 56  Respiratory Rate: 16  BP (Non-Invasive): 110/66  SpO2: 97 %  Oxygen Therapy  SpO2: 97 %  $ O2 Delivery: None (Room Air)    Awake, alert, fluent historian  Head normocephalic and atraumatic  Mucous membranes moist  Neck supple without midline tenderness  Lungs clear and work of breathing normal  Distal pulses equal and full  Abdomen soft and non tender  Skin warm, dry and well perfused  Moves all extremities well  CN II-XII intact to gross challenge  5/5 motor strength bilateral upper and lower extremities  No pronator drift  Finger nose/heel shin test intact  No sensory deficit to light touch      Data/Test :    EKG : Please see EKG  Images Personally Reviewed : CT brain NAP   Image Reports Reviewed:    Labs:  Laboratory values obtained as documented in the patients chart and the resident note.  Notable lab values:      Review of Prior Data :       Prior Images : None  Prior EKG : None  Online Medical Records:  None  Transfer Docs/Images:  None    Initial Assessment:   Per Rodman Pickle    MDM/Plan:    Per Rodman Pickle    ED Course:   Per Rodman Pickle        PROCEDURES:      Disposition: Discharged    Clinical Impression:     Encounter Diagnosis   Name Primary?   . Syncope and collapse Yes       CRITICAL CARE : None    This note was completed after the conclusion of care given the need for direct patient care at the time of service.

## 2019-03-05 ENCOUNTER — Encounter: Payer: Self-pay | Admitting: Family Medicine

## 2019-03-05 ENCOUNTER — Ambulatory Visit (INDEPENDENT_AMBULATORY_CARE_PROVIDER_SITE_OTHER): Payer: BC Managed Care – PPO | Admitting: Family Medicine

## 2019-03-05 ENCOUNTER — Other Ambulatory Visit: Payer: Self-pay

## 2019-03-05 VITALS — BP 128/78 | HR 53 | Temp 97.7°F | Ht 72.0 in | Wt 213.4 lb

## 2019-03-05 DIAGNOSIS — H269 Unspecified cataract: Secondary | ICD-10-CM

## 2019-03-05 DIAGNOSIS — Z0000 Encounter for general adult medical examination without abnormal findings: Secondary | ICD-10-CM | POA: Diagnosis not present

## 2019-03-05 DIAGNOSIS — Z7689 Persons encountering health services in other specified circumstances: Secondary | ICD-10-CM | POA: Insufficient documentation

## 2019-03-05 NOTE — Progress Notes (Signed)
New Patient Office Visit  Subjective:  Patient ID: William Mcclure, male    DOB: [birth year]  Age: 33 y.o. MRN: [id number]  CC:  Chief Complaint  Patient presents with  . Establish Care  . Hyperlipidemia    concerns wants to have lipids checked    HPI William Mcclure presents for health maintenance -needs labwork for work biometric screening No concerns  Past Medical History:  Diagnosis Date  . Cataract   . Glaucoma   Bradycardia due to fitness-track/field  in College  Family History  Problem Relation Age of Onset  . Healthy Mother   . Cancer Father   . Hyperlipidemia Father   skin cancer in grandparents Dad with non Hodgkin's lymphoma  Social History  Lives with wife-ER doc Works in [place] History  . Marital status: Married    Spouse name: Not on file  . Number of children: Not on file  . Years of education: Not on file  . Highest education level: Not on file  Occupational History  . Not on file  Social Needs  . Financial resource strain: Not on file  . Food insecurity    Worry: Not on file    Inability: Not on file  . Transportation needs    Medical: Not on file    Non-medical: Not on file  Tobacco Use  . Smoking status: Never Smoker  . Smokeless tobacco: Never Used  Substance and Sexual Activity  . Alcohol use: Never    Frequency: Never  . Drug use: Never  . Sexual activity: Yes  Lifestyle  . Physical activity    Days per week: Not on file    Minutes per session: Not on file  . Stress: Not on file  Relationships  . Social Herbalist on phone: Not on file    Gets together: Not on file    Attends religious service: Not on file    Active member of club or organization: Not on file    Attends meetings of clubs or organizations: Not on file    Relationship status: Not on file  . Intimate partner violence    Fear of current or ex partner: Not on file    Emotionally abused: Not on file    Physically abused: Not  on file    Forced sexual activity: Not on file  Other Topics Concern  . Not on file  Social History Narrative  . Not on file    ROS Review of Systems  Constitutional: Negative.   HENT: Negative.   Eyes:       Glaucoma-left eye Cataracts -bilat Eye specialist  Respiratory: Negative.   Cardiovascular: Negative.   Gastrointestinal: Negative.   Endocrine: Negative.   Genitourinary: Negative.   Musculoskeletal: Negative.   Skin: Negative.   Allergic/Immunologic: Negative.   Neurological: Negative.   Hematological: Negative.   Psychiatric/Behavioral: Negative.     Objective:   Today's Vitals: BP (!) 146/79 (BP Location: Left Arm, Patient Position: Sitting, Cuff Size: Normal)   Pulse (!) 53   Temp 97.7 F (36.5 C) (Oral)   Ht 6' (1.829 m)   Wt 213 lb 6.4 oz (96.8 kg)   SpO2 97%   BMI 28.94 kg/m   Physical Exam Constitutional:      Appearance: Normal appearance. He is normal weight.  HENT:     Head: Normocephalic and atraumatic.     Right Ear: Tympanic membrane, ear canal and external ear normal.  Left Ear: Tympanic membrane, ear canal and external ear normal.     Nose: Nose normal.  Eyes:     Conjunctiva/sclera: Conjunctivae normal.  Neck:     Musculoskeletal: Normal range of motion and neck supple.  Cardiovascular:     Rate and Rhythm: Normal rate and regular rhythm.     Pulses: Normal pulses.     Heart sounds: Normal heart sounds.  Pulmonary:     Effort: Pulmonary effort is normal.     Breath sounds: Normal breath sounds.  Abdominal:     General: Abdomen is flat. Bowel sounds are normal.     Palpations: Abdomen is soft.  Musculoskeletal: Normal range of motion.  Skin:    General: Skin is warm.  Neurological:     Mental Status: He is alert and oriented to person, place, and time.  Psychiatric:        Mood and Affect: Mood normal.        Behavior: Behavior normal.    1. Routine adult health maintenance Pt with regular exercise-needs labwork for  biometric screening - Glucose - Lipid panel  2. Cataract, unspecified cataract type, unspecified laterality F/u needed for ongoing evaluation and treatment-glaucoma/cataracts - Ambulatory referral to Ophthalmology Assessment & Plan:  Follow-up:  prn  LISA Mat Carne, MD

## 2019-03-06 LAB — LIPID PANEL
Cholesterol: 124 mg/dL (ref <200)
HDL: 41 mg/dL (ref >=40)
LDL Cholesterol (Calc): 68 mg/dL (calc)
Non-HDL Cholesterol (Calc): 83 mg/dL (calc) (ref <130)
Total CHOL/HDL Ratio: 3 (calc) (ref <5.0)
Triglycerides: 71 mg/dL (ref <150)

## 2019-03-06 LAB — GLUCOSE, RANDOM: Glucose, Bld: 93 mg/dL (ref 65–99)

## 2019-04-24 ENCOUNTER — Telehealth: Payer: Self-pay | Admitting: Family Medicine

## 2019-04-24 NOTE — Telephone Encounter (Signed)
Patient is calling and states he is having left knee pain and is needing to know if he can get a referral to a orthopedic or if he needs to be seen first.

## 2019-04-24 NOTE — Telephone Encounter (Signed)
Routing to Dr. Corum for advice ? 

## 2019-04-25 NOTE — Telephone Encounter (Signed)
Patient is doing a phone visit on 04/30/19

## 2019-04-25 NOTE — Telephone Encounter (Signed)
We can do a tele-visit. For insurance we need to know a diagnosis and if injury need to know the level of care pts needs

## 2019-04-30 ENCOUNTER — Other Ambulatory Visit: Payer: Self-pay

## 2019-04-30 ENCOUNTER — Telehealth (INDEPENDENT_AMBULATORY_CARE_PROVIDER_SITE_OTHER): Payer: BC Managed Care – PPO | Admitting: Family Medicine

## 2019-04-30 DIAGNOSIS — G8929 Other chronic pain: Secondary | ICD-10-CM | POA: Diagnosis not present

## 2019-04-30 DIAGNOSIS — M25562 Pain in left knee: Secondary | ICD-10-CM

## 2019-04-30 NOTE — Progress Notes (Signed)
Virtual Visit via Telephone Note  I connected with William Mcclure on 04/30/19 at  9:40 AM EST by telephone and verified that I am speaking with the correct person using two identifiers. DOB/address Location: Patient: home Provider:clinic   I discussed the limitations, risks, security and privacy concerns of performing an evaluation and management service by telephone and the availability of in person appointments. I also discussed with the patient that there may be a patient responsible charge related to this service. The patient expressed understanding and agreed to proceed.   History of Present Illness: Pt with left knee injury in 2017-playing basketball-front of the knee pain. Pt running down the court and knee "gave out" with sharp pain. Pt fell to the ground and could not put weight on it. No fall previously and no one hit the knee.   Pt states no evaluation-ice/elevation-improvement in knee so no previous evaluation Pt states strengthening prior to-re-onset of pain Pt states new symptoms- 1 month ago-pt states he awoke and knee felt funny. Pt states he felt restricted and felt need to "pop" knee. Pt does not recall a specific injury. Pt runs on treadmill for cardio Pt rides dirt bikes 2 weeks ago with no pain. Pt states the next week the knee started to have pain when riding. Pt states "tightning up" Pt taking ibuprofen and icing and elevation-improving swelling-lateral swelling. Worse pain with walking up the stairs. Tightness noted.  Pain Scale 1-2/10.    pt running 5-6 miles /week Ankle injury in HS No right knee pain No hip or pelvic pain No back or UE pain Takes Vit C Observations/Objective:  none Assessment and Plan:  1. Chronic pain of left knee Noted first 3 years ago-no evaluation, no xrays-ortho referral Follow Up Instructions:    I discussed the assessment and treatment plan with the patient. The patient was provided an opportunity to ask questions and all were  answered. The patient agreed with the plan and demonstrated an understanding of the instructions.   The patient was advised to call back or seek an in-person evaluation if the symptoms worsen or if the condition fails to improve as anticipated.  I provided 8 minutes of non-face-to-face time during this encounter.   LISA Hannah Beat, MD

## 2019-04-30 NOTE — Patient Instructions (Signed)
Keep appointment with ortho for follow up Aleve 1-2 twice a day with food for pain Vit D 1000IU daily Use trainer to strengthen. Avoid isolation of the left knee for lifting /squatting before evaluation

## 2019-05-07 ENCOUNTER — Other Ambulatory Visit: Payer: Self-pay

## 2019-05-07 ENCOUNTER — Ambulatory Visit
Admission: EM | Admit: 2019-05-07 | Discharge: 2019-05-07 | Disposition: A | Discharge status: Home / Self Care | Payer: BC Managed Care – PPO | Attending: Emergency Medicine | Admitting: Emergency Medicine

## 2019-05-07 DIAGNOSIS — M791 Myalgia, unspecified site: Secondary | ICD-10-CM

## 2019-05-07 DIAGNOSIS — Z20822 Contact with and (suspected) exposure to covid-19: Secondary | ICD-10-CM

## 2019-05-07 DIAGNOSIS — R05 Cough: Secondary | ICD-10-CM | POA: Diagnosis not present

## 2019-05-07 DIAGNOSIS — Z20828 Contact with and (suspected) exposure to other viral communicable diseases: Secondary | ICD-10-CM

## 2019-05-07 MED ORDER — BENZONATATE 100 MG PO CAPS: 100.0000 mg | ORAL_CAPSULE | Freq: Three times a day (TID) | ORAL | 0 refills | Status: DC

## 2019-05-07 NOTE — ED Provider Notes (Signed)
[facility]    CSN: [id number] Arrival date & time: 05/07/19  1241      History   Chief Complaint Chief Complaint  Patient presents with  . Cough  . Generalized Body Aches    HPI William Mcclure is a 33 y.o. male.   William Mcclure 33 years old male presented to the urgent care with a complaint of generalized body aches, cough x3 to 4 days.  Patient stated his wife tested positive for COVID-19.  Denies sick exposure to flu or strep.  Denies recent travel.  Denies aggravating or alleviating symptoms.  Denies previous COVID infection.   Denies fever, chills, fatigue, nasal congestion, rhinorrhea, sore throat, SOB, wheezing, chest pain, nausea, vomiting, changes in bowel or bladder habits.     The history is provided by the patient. No language interpreter was used.  Cough   Past Medical History:  Diagnosis Date  . Cataract   . Glaucoma     Patient Active Problem List   Diagnosis Date Noted  . Chronic pain of left knee 04/30/2019  . Routine adult health maintenance 03/05/2019  . Cataract 03/05/2019    Past Surgical History:  Procedure Laterality Date  . CATARACT EXTRACTION, BILATERAL         Home Medications    Prior to Admission medications   Medication Sig Start Date End Date Taking? Authorizing Provider  benzonatate (TESSALON) 100 MG capsule Take 1 capsule (100 mg total) by mouth every 8 (eight) hours. 05/07/19   AvegnoZachery Dakins, FNP    Family History Family History  Problem Relation Age of Onset  . Healthy Mother   . Cancer Father   . Hyperlipidemia Father     Social History Social History   Tobacco Use  . Smoking status: Never Smoker  . Smokeless tobacco: Never Used  Substance Use Topics  . Alcohol use: Never  . Drug use: Never     Allergies   Patient has no known allergies.   Review of Systems Review of Systems  Constitutional: Negative.   HENT: Negative.   Respiratory: Positive for cough.   Cardiovascular:  Negative.   Gastrointestinal: Negative.   Musculoskeletal:       Body-aches  Neurological: Negative.      Physical Exam Triage Vital Signs ED Triage Vitals  Enc Vitals Group     BP 05/07/19 1321 122/79     Pulse Rate 05/07/19 1321 67     Resp 05/07/19 1321 18     Temp 05/07/19 1321 97.9 F (36.6 C)     Temp src --      SpO2 05/07/19 1321 96 %     Weight --      Height --      Head Circumference --      Peak Flow --      Pain Score 05/07/19 1319 4     Pain Loc --      Pain Edu? --      Excl. in GC? --    No data found.  Updated Vital Signs BP 122/79   Pulse 67   Temp 97.9 F (36.6 C)   Resp 18   SpO2 96%   Visual Acuity Right Eye Distance:   Left Eye Distance:   Bilateral Distance:    Right Eye Near:   Left Eye Near:    Bilateral Near:     Physical Exam Vitals and nursing note reviewed.  Constitutional:      General: He is  not in acute distress.    Appearance: Normal appearance. He is normal weight. He is not ill-appearing or toxic-appearing.  HENT:     Head: Normocephalic.     Right Ear: Tympanic membrane, ear canal and external ear normal. There is no impacted cerumen.     Left Ear: Ear canal and external ear normal. There is no impacted cerumen.     Nose: Nose normal. No congestion.     Mouth/Throat:     Mouth: Mucous membranes are moist.     Pharynx: No oropharyngeal exudate or posterior oropharyngeal erythema.  Cardiovascular:     Rate and Rhythm: Normal rate and regular rhythm.     Pulses: Normal pulses.     Heart sounds: Normal heart sounds. No murmur.  Pulmonary:     Effort: Pulmonary effort is normal. No respiratory distress.     Breath sounds: No wheezing or rhonchi.  Chest:     Chest wall: No tenderness.  Abdominal:     General: Abdomen is flat. Bowel sounds are normal. There is no distension.     Palpations: There is no mass.  Skin:    Capillary Refill: Capillary refill takes less than 2 seconds.  Neurological:     Mental Status:  He is alert and oriented to person, place, and time.      UC Treatments / Results  Labs (all labs ordered are listed, but only abnormal results are displayed) Labs Reviewed  NOVEL CORONAVIRUS, NAA    EKG   Radiology No results found.  Procedures Procedures (including critical care time)  Medications Ordered in UC Medications - No data to display  Initial Impression / Assessment and Plan / UC Course  I have reviewed the triage vital signs and the nursing notes.  Pertinent labs & imaging results that were available during my care of the patient were reviewed by me and considered in my medical decision making (see chart for details).   Patient stable for discharge.  Benign physical exam.  Prescribed Tessalon Perles to manage cough.  Advised patient to take Tylenol for pain and fever.  Will call patient if COVID-19 test result is abnormal.  Final Clinical Impressions(s) / UC Diagnoses   Final diagnoses:  Suspected COVID-19 virus infection     Discharge Instructions     COVID testing ordered.  It will take between 2-7 days for test results.  Someone will contact you regarding abnormal results.    In the meantime: You should remain isolated in your home for 10 days from symptom onset Get plenty of rest and push fluids Tessalon Perles prescribed for cough Use medications daily for symptom relief Use OTC medications like ibuprofen or tylenol as needed fever or pain Call or go to the ED if you have any new or worsening symptoms such as fever, worsening cough, shortness of breath, chest tightness, chest pain, turning blue, changes in mental status, etc...     ED Prescriptions    Medication Sig Dispense Auth. Provider   benzonatate (TESSALON) 100 MG capsule Take 1 capsule (100 mg total) by mouth every 8 (eight) hours. 21 capsule Quill Grinder, Darrelyn Hillock, FNP     PDMP not reviewed this encounter.   Emerson Monte, [place] 05/07/19 1420

## 2019-05-07 NOTE — Discharge Instructions (Signed)
COVID testing ordered.  It will take between 2-7 days for test results.  Someone will contact you regarding abnormal results.   ° °In the meantime: °You should remain isolated in your home for 10 days from symptom onset Get plenty of rest and push fluids °Tessalon Perles prescribed for cough °Use medications daily for symptom relief °Use OTC medications like ibuprofen or tylenol as needed fever or pain °Call or go to the ED if you have any new or worsening symptoms such as fever, worsening cough, shortness of breath, chest tightness, chest pain, turning blue, changes in mental status, etc...  °

## 2019-05-09 LAB — NOVEL CORONAVIRUS, NAA: SARS-CoV-2, NAA: DETECTED — AB

## 2019-05-10 ENCOUNTER — Telehealth: Payer: Self-pay | Admitting: Emergency Medicine

## 2019-05-10 NOTE — Telephone Encounter (Signed)

## 2019-06-05 ENCOUNTER — Ambulatory Visit: Payer: BC Managed Care – PPO | Admitting: Orthopaedic Surgery

## 2019-06-06 DIAGNOSIS — Z961 Presence of intraocular lens: Secondary | ICD-10-CM | POA: Diagnosis not present

## 2019-06-06 DIAGNOSIS — H40023 Open angle with borderline findings, high risk, bilateral: Secondary | ICD-10-CM | POA: Diagnosis not present

## 2019-06-06 DIAGNOSIS — H179 Unspecified corneal scar and opacity: Secondary | ICD-10-CM | POA: Diagnosis not present

## 2019-06-07 ENCOUNTER — Ambulatory Visit: Payer: BC Managed Care – PPO | Admitting: Orthopaedic Surgery

## 2019-06-12 ENCOUNTER — Ambulatory Visit: Payer: BC Managed Care – PPO | Admitting: Orthopaedic Surgery

## 2019-06-12 ENCOUNTER — Encounter: Payer: Self-pay | Admitting: Orthopaedic Surgery

## 2019-06-12 ENCOUNTER — Ambulatory Visit: Payer: BC Managed Care – PPO

## 2019-06-12 ENCOUNTER — Other Ambulatory Visit: Payer: Self-pay

## 2019-06-12 VITALS — BP 117/73 | HR 63 | Ht 72.0 in | Wt 215.0 lb

## 2019-06-12 DIAGNOSIS — Q1389 Other congenital malformations of anterior segment of eye: Secondary | ICD-10-CM | POA: Insufficient documentation

## 2019-06-12 DIAGNOSIS — H509 Unspecified strabismus: Secondary | ICD-10-CM | POA: Insufficient documentation

## 2019-06-12 DIAGNOSIS — M25562 Pain in left knee: Secondary | ICD-10-CM | POA: Diagnosis not present

## 2019-06-12 DIAGNOSIS — Q134 Other congenital corneal malformations: Secondary | ICD-10-CM | POA: Insufficient documentation

## 2019-06-12 DIAGNOSIS — G8929 Other chronic pain: Secondary | ICD-10-CM

## 2019-06-12 DIAGNOSIS — H53001 Unspecified amblyopia, right eye: Secondary | ICD-10-CM | POA: Insufficient documentation

## 2019-06-12 DIAGNOSIS — H179 Unspecified corneal scar and opacity: Secondary | ICD-10-CM | POA: Insufficient documentation

## 2019-06-12 NOTE — Progress Notes (Signed)
Patient VZ:CHYIFOY [name], male DOB:08/30/1985, 34 y.o. DXA:[id number]  Chief Complaint  Patient presents with  . Knee Pain    left x several months worse recently     HPI  William Mcclure is a 34 y.o. male who has increasing pain of the left knee with popping and swelling.  He has occasional giving way.  I am concerned about meniscus tear.  He has tried NSAIDs with no help. This has been doing on several months.  I will get MRI.  I am concerned he has meniscus tear.   Body mass index is 29.16 kg/m.  ROS  Review of Systems  Constitutional: Positive for activity change.  Musculoskeletal: Positive for arthralgias, gait problem and joint swelling.  All other systems reviewed and are negative.   All other systems reviewed and are negative.  The following is a summary of the past history medically, past history surgically, known current medicines, social history and family history.  This information is gathered electronically by the computer from prior information and documentation.  I review this each visit and have found including this information at this point in the chart is beneficial and informative.    Past Medical History:  Diagnosis Date  . Cataract   . Glaucoma     Past Surgical History:  Procedure Laterality Date  . CATARACT EXTRACTION, BILATERAL      Family History  Problem Relation Age of Onset  . Healthy Mother   . Cancer Father   . Hyperlipidemia Father     Social History Social History   Tobacco Use  . Smoking status: Never Smoker  . Smokeless tobacco: Never Used  Substance Use Topics  . Alcohol use: Never  . Drug use: Never    No Known Allergies  No current outpatient medications on file.   No current facility-administered medications for this visit.     Physical Exam  Blood pressure 117/73, pulse 63, height 6' (1.829 m), weight 215 lb (97.5 kg).  Constitutional: overall normal hygiene, normal nutrition, well developed, normal  grooming, normal body habitus. Assistive device:none  Musculoskeletal: gait and station Limp left, muscle tone and strength are normal, no tremors or atrophy is present.  .  Neurological: coordination overall normal.  Deep tendon reflex/nerve stretch intact.  Sensation normal.  Cranial nerves II-XII intact.   Skin:   Normal overall no scars, lesions, ulcers or rashes. No psoriasis.  Psychiatric: Alert and oriented x 3.  Recent memory intact, remote memory unclear.  Normal mood and affect. Well groomed.  Good eye contact.  Cardiovascular: overall no swelling, no varicosities, no edema bilaterally, normal temperatures of the legs and arms, no clubbing, cyanosis and good capillary refill.  Lymphatic: palpation is normal.  Left knee has crepitus, ROM 0 to 110, slight effusion, medial knee pain, positive medial [place], NV intact.  Limp left.  All other systems reviewed and are negative   The patient has been educated about the nature of the problem(s) and counseled on treatment options.  The patient appeared to understand what I have discussed and is in agreement with it.  Encounter Diagnosis  Name Primary?  . Chronic pain of left knee Yes   X-rays were done of the left knee, reported separately. PLAN Call if any problems.  Precautions discussed.  Continue current medications.   Return to clinic 2 weeks   Get MRI of the left knee.  Electronically Signed William Mclean, MD 1/19/20219:38 AM

## 2019-06-22 ENCOUNTER — Other Ambulatory Visit: Payer: Self-pay

## 2019-06-22 ENCOUNTER — Ambulatory Visit (HOSPITAL_COMMUNITY)
Admission: RE | Admit: 2019-06-22 | Discharge: 2019-06-22 | Disposition: A | Discharge status: Home / Self Care | Payer: BC Managed Care – PPO | Source: Ambulatory Visit | Attending: Orthopaedic Surgery | Admitting: Orthopaedic Surgery

## 2019-06-22 DIAGNOSIS — M25562 Pain in left knee: Secondary | ICD-10-CM | POA: Insufficient documentation

## 2019-06-22 DIAGNOSIS — G8929 Other chronic pain: Secondary | ICD-10-CM | POA: Diagnosis not present

## 2019-06-26 ENCOUNTER — Ambulatory Visit: Payer: BC Managed Care – PPO | Admitting: Orthopaedic Surgery

## 2019-06-28 ENCOUNTER — Other Ambulatory Visit: Payer: Self-pay

## 2019-06-28 ENCOUNTER — Ambulatory Visit (INDEPENDENT_AMBULATORY_CARE_PROVIDER_SITE_OTHER): Payer: BC Managed Care – PPO | Admitting: Orthopaedic Surgery

## 2019-06-28 ENCOUNTER — Encounter: Payer: Self-pay | Admitting: Orthopaedic Surgery

## 2019-06-28 VITALS — Ht 72.0 in | Wt 218.0 lb

## 2019-06-28 DIAGNOSIS — M25562 Pain in left knee: Secondary | ICD-10-CM

## 2019-06-28 DIAGNOSIS — G8929 Other chronic pain: Secondary | ICD-10-CM

## 2019-06-28 NOTE — Patient Instructions (Signed)
Meniscus Tear  A meniscus tear is a knee injury that happens when a piece of the meniscus is torn. The meniscus is a thick, rubbery, wedge-shaped cartilage in the knee. Two menisci are located in each knee. They sit between the upper bone (femur) and lower bone (tibia) that make up the knee joint. Each meniscus acts as a shock absorber for the knee. A torn meniscus is one of the most common types of knee injuries. This injury can range from mild to severe. Surgery may be needed to repair a severe tear. What are the causes? This condition may be caused by any kneeling, squatting, twisting, or pivoting movement. Sports-related injuries are the most common cause. These often occur from:  Running and stopping suddenly. ? Changing direction. ? Being tackled or knocked off your feet.  Lifting or carrying heavy weights. As people get older, their menisci get thinner and weaker. In these people, tears can happen more easily, such as from climbing stairs. What increases the risk? You are more likely to develop this condition if you:  Play contact sports.  Have a job that requires kneeling or squatting.  Are male.  Are over 40 years old. What are the signs or symptoms? Symptoms of this condition include:  Knee pain, especially at the side of the knee joint. You may feel pain when the injury occurs, or you may only hear a pop and feel pain later.  A feeling that your knee is clicking, catching, locking, or giving way.  Not being able to fully bend or extend your knee.  Bruising or swelling in your knee. How is this diagnosed? This condition may be diagnosed based on your symptoms and a physical exam. You may also have tests, such as:  X-rays.  MRI.  A procedure to look inside your knee with a narrow surgical telescope (arthroscopy). You may be referred to a knee specialist (orthopedic surgeon). How is this treated? Treatment for this injury depends on the severity of the tear.  Treatment for a mild tear may include:  Rest.  Medicine to reduce pain and swelling. This is usually a nonsteroidal anti-inflammatory drug (NSAID), like ibuprofen.  A knee brace, sleeve, or wrap.  Using crutches or a walker to keep weight off your knee and to help you walk.  Exercises to strengthen your knee (physical therapy). You may need surgery if you have a severe tear or if other treatments are not working. Follow these instructions at home: If you have a brace, sleeve, or wrap:  Wear it as told by your health care provider. Remove it only as told by your health care provider.  Loosen the brace, sleeve, or wrap if your toes tingle, become numb, or turn cold and blue.  Keep the brace, sleeve, or wrap clean and dry.  If the brace, sleeve, or wrap is not waterproof: ? Do not let it get wet. ? Cover it with a watertight covering when you take a bath or shower. Managing pain and swelling   Take over-the-counter and prescription medicines only as told by your health care provider.  If directed, put ice on your knee: ? If you have a removable brace, sleeve, or wrap, remove it as told by your health care provider. ? Put ice in a plastic bag. ? Place a towel between your skin and the bag. ? Leave the ice on for 20 minutes, 2-3 times per day.  Move your toes often to avoid stiffness and to lessen swelling.  Raise (  elevate) the injured area above the level of your heart while you are sitting or lying down. Activity  Do not use the injured limb to support your body weight until your health care provider says that you can. Use crutches or a walker as told by your health care provider.  Return to your normal activities as told by your health care provider. Ask your health care provider what activities are safe for you.  Perform range-of-motion exercises only as told by your health care provider.  Begin doing exercises to strengthen your knee and leg muscles only as told by your  health care provider. After you recover, your health care provider may recommend these exercises to help prevent another injury. General instructions  Use a knee brace, sleeve, or wrap as told by your health care provider.  Ask your health care provider when it is safe to drive if you have a brace, sleeve, or wrap on your knee.  Do not use any products that contain nicotine or tobacco, such as cigarettes, e-cigarettes, and chewing tobacco. If you need help quitting, ask your health care provider.  Ask your health care provider if the medicine prescribed to you: ? Requires you to avoid driving or using heavy machinery. ? Can cause constipation. You may need to take these actions to prevent or treat constipation:  Drink enough fluid to keep your urine pale yellow.  Take over-the-counter or prescription medicines.  Eat foods that are high in fiber, such as beans, whole grains, and fresh fruits and vegetables.  Limit foods that are high in fat and processed sugars, such as fried or sweet foods.  Keep all follow-up visits as told by your health care provider. This is important. Contact a health care provider if:  You have a fever.  Your knee becomes red, tender, or swollen.  Your pain medicine is not helping.  Your symptoms get worse or do not improve after 2 weeks of home care. Summary  A meniscus tear is a knee injury that happens when a piece of the meniscus is torn.  Treatment for this injury depends on the severity of the tear. You may need surgery if you have a severe tear or if other treatments are not working.  Rest, ice, and raise (elevate) your injured knee as told by your health care provider. This will help lessen pain and swelling.  Contact a health care provider if you have new symptoms, or your symptoms get worse or do not improve after 2 weeks of home care.  Keep all follow-up visits as told by your health care provider. This is important. This information is not  intended to replace advice given to you by your health care provider. Make sure you discuss any questions you have with your health care provider. Document Revised: 11/22/2017 Document Reviewed: 11/22/2017 Elsevier Patient Education  2020 [employer]  

## 2019-06-28 NOTE — Progress Notes (Signed)
William Mcclure, male DOB:1985-12-04, 34 y.o. William Mcclure  Chief Complaint  William presents with  . Knee Pain    HPI  William Mcclure is a 34 y.o. male who has left knee pain with giving way.  He had MRI which showed: IMPRESSION: 1. Horizontal tear posterior horn lateral meniscus reaches the tibial articular surface. A second horizontal tear at the junction of the anterior horn and body reaches the femoral articular surface. 2. Mild patellofemoral osteoarthritis.  I have independently reviewed the MRI and concur with the report.  I have explained the findings to him.  I have recommended he see Dr. Aline Brochure for possible arthroscopy of the knee.  I have explained the procedure to him.  He is agreeable to this.   Body mass index is 29.57 kg/m.  ROS  Review of Systems  Constitutional: Positive for activity change.  Musculoskeletal: Positive for arthralgias, gait problem and joint swelling.  All other systems reviewed and are negative.   All other systems reviewed and are negative.  The following is a summary of the past history medically, past history surgically, known current medicines, social history and family history.  This information is gathered electronically by the computer from prior information and documentation.  I review this each visit and have found including this information at this point in the chart is beneficial and informative.    Past Medical History:  Diagnosis Date  . Cataract   . Glaucoma     Past Surgical History:  Procedure Laterality Date  . CATARACT EXTRACTION, BILATERAL      Family History  Problem Relation Age of Onset  . Healthy Mother   . Cancer Father   . Hyperlipidemia Father     Social History Social History   Tobacco Use  . Smoking status: Never Smoker  . Smokeless tobacco: Never Used  Substance Use Topics  . Alcohol use: Never  . Drug use: Never    No Known Allergies  No current outpatient medications  on file.   No current facility-administered medications for this visit.     Physical Exam  Height 6' (1.829 m), weight 218 lb (98.9 kg).  Constitutional: overall normal hygiene, normal nutrition, well developed, normal grooming, normal body habitus. Assistive device:none  Musculoskeletal: gait and station Limp left, muscle tone and strength are normal, no tremors or atrophy is present.  .  Neurological: coordination overall normal.  Deep tendon reflex/nerve stretch intact.  Sensation normal.  Cranial nerves II-XII intact.   Skin:   Normal overall no scars, lesions, ulcers or rashes. No psoriasis.  Psychiatric: Alert and oriented x 3.  Recent memory intact, remote memory unclear.  Normal mood and affect. Well groomed.  Good eye contact.  Cardiovascular: overall no swelling, no varicosities, no edema bilaterally, normal temperatures of the legs and arms, no clubbing, cyanosis and good capillary refill.  Lymphatic: palpation is normal.  Left knee is tender, more laterally, has effusion, slight crepitus, slight limp left, positive lateral Mc[place]ray  NV intact.  All other systems reviewed and are negative   The William has been educated about the nature of the problem(s) and counseled on treatment options.  The William appeared to understand what I have discussed and is in agreement with it.  Encounter Diagnosis  Name Primary?  . Chronic pain of left knee Yes    PLAN Call if any problems.  Precautions discussed.  Continue current medications.   Return to clinic to see Dr. Aline Brochure.   Electronically [place],  MD 2/4/202111:10 AM

## 2019-07-06 ENCOUNTER — Telehealth: Payer: Self-pay

## 2019-07-06 NOTE — Telephone Encounter (Signed)
Patient called and canceled the consult appointment he had scheduled for Tuesday, July 10, 2019 @ 2 pm. I asked if he wanted to reschedule at a later date and he said he didn't think surgery was going to do it.

## 2019-07-10 ENCOUNTER — Ambulatory Visit: Payer: BC Managed Care – PPO | Admitting: Orthopedic Surgery

## 2020-05-27 ENCOUNTER — Encounter: Payer: Self-pay | Admitting: Internal Medicine

## 2020-05-27 ENCOUNTER — Ambulatory Visit (INDEPENDENT_AMBULATORY_CARE_PROVIDER_SITE_OTHER): Payer: BC Managed Care – PPO | Admitting: Internal Medicine

## 2020-05-27 ENCOUNTER — Other Ambulatory Visit: Payer: Self-pay

## 2020-05-27 VITALS — BP 128/80 | HR 59 | Temp 98.0°F | Resp 18 | Ht 77.0 in | Wt 219.4 lb

## 2020-05-27 DIAGNOSIS — R0683 Snoring: Secondary | ICD-10-CM

## 2020-05-27 DIAGNOSIS — Q134 Other congenital corneal malformations: Secondary | ICD-10-CM

## 2020-05-27 DIAGNOSIS — Q1389 Other congenital malformations of anterior segment of eye: Secondary | ICD-10-CM | POA: Diagnosis not present

## 2020-05-27 DIAGNOSIS — Z7689 Persons encountering health services in other specified circumstances: Secondary | ICD-10-CM

## 2020-05-27 NOTE — Patient Instructions (Addendum)
Please follow up with Dr Gerilyn Pilgrim as scheduled for sleep study.  Please continue to follow heart healthy diet and moderate exercise/walking at least 150 mins/week.  Please maintain simple sleep hygiene. - Maintain dark and non-noisy environment in the bedroom. - Please use the bedroom for sleep and sexual activity only. - Do not use electronic devices in the bedroom. - Please take dinner at least 2 hours before bedtime. - Please avoid caffeinated products in the evening, including coffee, soft drinks. - Please try to maintain the regular sleep-wake cycle - Go to bed and wake up at the same time.

## 2020-05-27 NOTE — Assessment & Plan Note (Signed)
Care established Previous chart reviewed History and medications reviewed with the patient 

## 2020-05-27 NOTE — Assessment & Plan Note (Signed)
Follows up with Ophthalmology Has had b/l cataract surgery Has right eye strabismus 

## 2020-05-27 NOTE — Assessment & Plan Note (Signed)
Partner has noticed snoring episodes, disturbing at night. Denies daytime sleepiness or fatigue. Referred for sleep study

## 2020-05-27 NOTE — Progress Notes (Signed)
New Patient Office Visit  Subjective:  Patient ID: William Mcclure, male    DOB: 28-May-1985  Age: 35 y.o. MRN: [id number]  CC:  Chief Complaint  Patient presents with  . New Patient (Initial Visit)    New patient wife is concerned as he has been snoring     HPI William Mcclure is a 35 year old male with PMH of [name], b/l cataract s/p surgery and right eye strabismus who presents for establishing care.  Patient states that he has been in good health overall. He mentions concern about snoring noticed by his wife. He denies daytime sleepiness or fatigue. He has had snoring for 7-8 years.  Patient has had 2 doses of COVID vaccine and flu vaccine as well.  Past Medical History:  Diagnosis Date  . Cataract   . Glaucoma     Past Surgical History:  Procedure Laterality Date  . CATARACT EXTRACTION, BILATERAL      Family History  Problem Relation Age of Onset  . Healthy Mother   . Cancer Father   . Hyperlipidemia Father     Social History   Socioeconomic History  . Marital status: Married    Spouse name: Not on file  . Number of children: Not on file  . Years of education: Not on file  . Highest education level: Not on file  Occupational History  . Not on file  Tobacco Use  . Smoking status: Never Smoker  . Smokeless tobacco: Never Used  Substance and Sexual Activity  . Alcohol use: Yes    Comment: Occasionally  . Drug use: Never  . Sexual activity: Yes  Other Topics Concern  . Not on file  Social History Narrative  . Not on file   Social Determinants of Health   Financial Resource Strain: Not on file  Food Insecurity: Not on file  Transportation Needs: Not on file  Physical Activity: Not on file  Stress: Not on file  Social Connections: Not on file  Intimate Partner Violence: Not on file    ROS Review of Systems  Constitutional: Negative for chills and fever.  HENT: Negative for congestion and sore throat.   Eyes: Negative for pain and  discharge.  Respiratory: Negative for cough and shortness of breath.   Cardiovascular: Negative for chest pain and palpitations.  Gastrointestinal: Negative for constipation, diarrhea, nausea and vomiting.  Endocrine: Negative for polydipsia and polyuria.  Genitourinary: Negative for dysuria and hematuria.  Musculoskeletal: Negative for neck pain and neck stiffness.  Skin: Negative for rash.  Neurological: Negative for dizziness, weakness, numbness and headaches.  Psychiatric/Behavioral: Negative for agitation and behavioral problems.    Objective:   Today's Vitals: BP 128/80 (BP Location: Left Arm, Patient Position: Sitting)   Pulse (!) 59   Temp 98 F (36.7 C) (Oral)   Resp 18   Ht _0  (1.956 m)   Wt 219 lb 6.4 oz (99.5 kg)   SpO2 96%   BMI 26.02 kg/m   Physical Exam Vitals reviewed.  Constitutional:      General: He is not in acute distress.    Appearance: He is not diaphoretic.  HENT:     Head: Normocephalic and atraumatic.     Nose: Nose normal.     Mouth/Throat:     Mouth: Mucous membranes are moist.  Eyes:     General: No scleral icterus.    Pupils: Pupils are equal, round, and reactive to light.     Comments: Right eye strabismus  Cardiovascular:     Rate and Rhythm: Normal rate and regular rhythm.     Pulses: Normal pulses.     Heart sounds: Normal heart sounds. No murmur heard.   Pulmonary:     Breath sounds: Normal breath sounds. No wheezing or rales.  Abdominal:     Palpations: Abdomen is soft.     Tenderness: There is no abdominal tenderness.  Musculoskeletal:     Cervical back: Neck supple. No tenderness.     Right lower leg: No edema.     Left lower leg: No edema.  Skin:    General: Skin is warm.     Findings: No rash.  Neurological:     General: No focal deficit present.     Mental Status: He is alert and oriented to person, place, and time.     Sensory: No sensory deficit.     Motor: No weakness.  Psychiatric:        Mood and Affect:  Mood normal.        Behavior: Behavior normal.     Assessment & Plan:   Problem List Items Addressed This Visit      Other   Encounter to establish care - Primary    Care established Previous chart reviewed History and medications reviewed with the patient      Relevant Orders   CBC with Differential   CMP14+EGFR   Hemoglobin A1c   Lipid Profile   TSH + free T4   Vitamin D (25 hydroxy)   [name]    Follows up with Ophthalmology Has had b/l cataract surgery Has right eye strabismus      Snoring    Partner has noticed snoring episodes, disturbing at night. Denies daytime sleepiness or fatigue. Referred for sleep study      Relevant Orders   Ambulatory referral to Neurology      No outpatient encounter medications on file as of 05/27/2020.   No facility-administered encounter medications on file as of 05/27/2020.    Follow-up: Return in about 6 months (around 11/24/2020) for Annual physical.   Lindell Spar, MD

## 2020-07-06 DIAGNOSIS — G4733 Obstructive sleep apnea (adult) (pediatric): Secondary | ICD-10-CM | POA: Insufficient documentation

## 2020-11-25 ENCOUNTER — Ambulatory Visit: Payer: BC Managed Care – PPO | Admitting: Internal Medicine

## 2020-12-22 ENCOUNTER — Other Ambulatory Visit: Payer: Self-pay

## 2020-12-22 ENCOUNTER — Ambulatory Visit (INDEPENDENT_AMBULATORY_CARE_PROVIDER_SITE_OTHER): Payer: BC Managed Care – PPO | Admitting: Internal Medicine

## 2020-12-22 ENCOUNTER — Encounter: Payer: Self-pay | Admitting: Internal Medicine

## 2020-12-22 VITALS — BP 130/84 | HR 54 | Temp 98.1°F | Resp 18 | Ht 72.0 in | Wt 221.8 lb

## 2020-12-22 DIAGNOSIS — E669 Obesity, unspecified: Secondary | ICD-10-CM

## 2020-12-22 DIAGNOSIS — Z1159 Encounter for screening for other viral diseases: Secondary | ICD-10-CM | POA: Diagnosis not present

## 2020-12-22 DIAGNOSIS — G4733 Obstructive sleep apnea (adult) (pediatric): Secondary | ICD-10-CM

## 2020-12-22 DIAGNOSIS — Z114 Encounter for screening for human immunodeficiency virus [HIV]: Secondary | ICD-10-CM

## 2020-12-22 DIAGNOSIS — E66811 Obesity, class 1: Secondary | ICD-10-CM | POA: Insufficient documentation

## 2020-12-22 NOTE — Patient Instructions (Signed)
Please get fasting blood tests done within a week.  Please continue to use CPAP machine as tolerated.

## 2020-12-22 NOTE — Assessment & Plan Note (Signed)
Uses CPAP regularly °

## 2020-12-22 NOTE — Progress Notes (Signed)
Established Patient Office Visit  Subjective:  Patient ID: William Mcclure, male    DOB: Oct 01, 1985  Age: 35 y.o. MRN: [id number]  CC:  Chief Complaint  Patient presents with   Follow-up    6 month follow up     HPI William Mcclure is a 35 year old male with PMH of OSA, [name], b/l cataract s/p surgery and right eye strabismus who presents for follow up of chronic medical conditions.  OSA: Has been using CPAP regularly. Has some difficulty with mask at times, but overall tolerates it.  Past Medical History:  Diagnosis Date   Cataract    Glaucoma     Past Surgical History:  Procedure Laterality Date   CATARACT EXTRACTION, BILATERAL      Family History  Problem Relation Age of Onset   Healthy Mother    Cancer Father    Hyperlipidemia Father     Social History   Socioeconomic History   Marital status: Married    Spouse name: Not on file   Number of children: Not on file   Years of education: Not on file   Highest education level: Not on file  Occupational History   Not on file  Tobacco Use   Smoking status: Never   Smokeless tobacco: Never  Substance and Sexual Activity   Alcohol use: Yes    Comment: Occasionally   Drug use: Never   Sexual activity: Yes  Other Topics Concern   Not on file  Social History Narrative   Not on file   Social Determinants of Health   Financial Resource Strain: Not on file  Food Insecurity: Not on file  Transportation Needs: Not on file  Physical Activity: Not on file  Stress: Not on file  Social Connections: Not on file  Intimate Partner Violence: Not on file    No outpatient medications prior to visit.   No facility-administered medications prior to visit.    No Known Allergies  ROS Review of Systems  Constitutional:  Negative for chills and fever.  HENT:  Negative for congestion and sore throat.   Eyes:  Negative for pain and discharge.  Respiratory:  Negative for cough and shortness of breath.    Cardiovascular:  Negative for chest pain and palpitations.  Gastrointestinal:  Negative for constipation, diarrhea, nausea and vomiting.  Endocrine: Negative for polydipsia and polyuria.  Genitourinary:  Negative for dysuria and hematuria.  Musculoskeletal:  Negative for neck pain and neck stiffness.  Skin:  Negative for rash.  Neurological:  Negative for dizziness, weakness, numbness and headaches.  Psychiatric/Behavioral:  Negative for agitation and behavioral problems.      Objective:    Physical Exam Vitals reviewed.  Constitutional:      General: He is not in acute distress.    Appearance: He is not diaphoretic.  HENT:     Head: Normocephalic and atraumatic.     Nose: Nose normal.     Mouth/Throat:     Mouth: Mucous membranes are moist.  Eyes:     General: No scleral icterus.    Pupils: Pupils are equal, round, and reactive to light.     Comments: Right eye strabismus  Cardiovascular:     Rate and Rhythm: Normal rate and regular rhythm.     Pulses: Normal pulses.     Heart sounds: Normal heart sounds. No murmur heard. Pulmonary:     Breath sounds: Normal breath sounds. No wheezing or rales.  Abdominal:     Palpations: Abdomen is  soft.     Tenderness: There is no abdominal tenderness.  Musculoskeletal:     Cervical back: Neck supple. No tenderness.     Right lower leg: No edema.     Left lower leg: No edema.  Skin:    General: Skin is warm.     Findings: No rash.  Neurological:     General: No focal deficit present.     Mental Status: He is alert and oriented to person, place, and time.     Sensory: No sensory deficit.     Motor: No weakness.  Psychiatric:        Mood and Affect: Mood normal.        Behavior: Behavior normal.    BP 130/84 (BP Location: Left Arm, Patient Position: Sitting, Cuff Size: Normal)   Pulse (!) 54   Temp 98.1 F (36.7 C) (Oral)   Resp 18   Ht 6' (1.829 m)   Wt 221 lb 12.8 oz (100.6 kg)   SpO2 96%   BMI 30.08 kg/m  Wt  Readings from Last 3 Encounters:  12/22/20 221 lb 12.8 oz (100.6 kg)  05/27/20 219 lb 6.4 oz (99.5 kg)  06/28/19 218 lb (98.9 kg)     Health Maintenance Due  Topic Date Due   COVID-19 Vaccine (1) Never done   Hepatitis C Screening  Never done   INFLUENZA VACCINE  12/22/2020    There are no preventive care reminders to display for this patient.  No results found for: TSH No results found for: WBC, HGB, HCT, MCV, PLT Lab Results  Component Value Date   GLUCOSE 93 03/05/2019   Lab Results  Component Value Date   CHOL 124 03/05/2019   Lab Results  Component Value Date   HDL 41 03/05/2019   Lab Results  Component Value Date   LDLCALC 68 03/05/2019   Lab Results  Component Value Date   TRIG 71 03/05/2019   Lab Results  Component Value Date   CHOLHDL 3.0 03/05/2019   No results found for: HGBA1C    Assessment & Plan:   Problem List Items Addressed This Visit       Respiratory   Obstructive sleep apnea of adult - Primary    Uses CPAP regularly       Relevant Orders   CBC   CMP14+EGFR   TSH     Other   Class 1 obesity without serious comorbidity in adult    Diet modification and moderate exercise as tolerated       Relevant Orders   Lipid Profile   Other Visit Diagnoses     Need for hepatitis C screening test       Relevant Orders   Hepatitis C Antibody   Encounter for screening for HIV       Relevant Orders   HIV antibody (with reflex)       No orders of the defined types were placed in this encounter.   Follow-up: Return in about 6 months (around 06/24/2021) for Annual physical.    Lindell Spar, MD

## 2020-12-22 NOTE — Assessment & Plan Note (Signed)
Diet modification and moderate exercise as tolerated 

## 2020-12-26 LAB — CMP14+EGFR
ALT: 29 IU/L (ref 0–44)
AST: 25 IU/L (ref 0–40)
Albumin/Globulin Ratio: 1.7 (ref 1.2–2.2)
Albumin: 4.4 g/dL (ref 4.0–5.0)
Alkaline Phosphatase: 80 IU/L (ref 44–121)
BUN/Creatinine Ratio: 19 (ref 9–20)
BUN: 19 mg/dL (ref 6–20)
Bilirubin Total: 0.3 mg/dL (ref 0.0–1.2)
CO2: 19 mmol/L — ABNORMAL LOW (ref 20–29)
Calcium: 9.1 mg/dL (ref 8.7–10.2)
Chloride: 104 mmol/L (ref 96–106)
Creatinine, Ser: 0.98 mg/dL (ref 0.76–1.27)
Globulin, Total: 2.6 g/dL (ref 1.5–4.5)
Glucose: 96 mg/dL (ref 65–99)
Potassium: 4.5 mmol/L (ref 3.5–5.2)
Sodium: 144 mmol/L (ref 134–144)
Total Protein: 7 g/dL (ref 6.0–8.5)
eGFR: 102 mL/min/{1.73_m2} (ref >59)

## 2020-12-26 LAB — CBC WITH DIFFERENTIAL/PLATELET
Basophils Absolute: 0 10*3/uL (ref 0.0–0.2)
Basos: 1 %
EOS (ABSOLUTE): 0.1 10*3/uL (ref 0.0–0.4)
Eos: 2 %
Hematocrit: 46.9 % (ref 37.5–51.0)
Hemoglobin: 15.9 g/dL (ref 13.0–17.7)
Immature Grans (Abs): 0 10*3/uL (ref 0.0–0.1)
Immature Granulocytes: 0 %
Lymphocytes Absolute: 1.6 10*3/uL (ref 0.7–3.1)
Lymphs: 29 %
MCH: 30.6 pg (ref 26.6–33.0)
MCHC: 33.9 g/dL (ref 31.5–35.7)
MCV: 90 fL (ref 79–97)
Monocytes Absolute: 0.6 10*3/uL (ref 0.1–0.9)
Monocytes: 10 %
Neutrophils Absolute: 3.3 10*3/uL (ref 1.4–7.0)
Neutrophils: 58 %
Platelets: 217 10*3/uL (ref 150–450)
RBC: 5.2 x10E6/uL (ref 4.14–5.80)
RDW: 12.5 % (ref 11.6–15.4)
WBC: 5.6 10*3/uL (ref 3.4–10.8)

## 2020-12-26 LAB — HEPATITIS C ANTIBODY: Hep C Virus Ab: 0.1 s/co ratio (ref 0.0–0.9)

## 2020-12-26 LAB — LIPID PANEL
Chol/HDL Ratio: 3.1 ratio (ref 0.0–5.0)
Cholesterol, Total: 130 mg/dL (ref 100–199)
HDL: 42 mg/dL (ref >39)
LDL Chol Calc (NIH): 70 mg/dL (ref 0–99)
Triglycerides: 94 mg/dL (ref 0–149)
VLDL Cholesterol Cal: 18 mg/dL (ref 5–40)

## 2020-12-26 LAB — HEMOGLOBIN A1C
Est. average glucose Bld gHb Est-mCnc: 114 mg/dL
Hgb A1c MFr Bld: 5.6 % (ref 4.8–5.6)

## 2020-12-26 LAB — HIV ANTIBODY (ROUTINE TESTING W REFLEX): HIV Screen 4th Generation wRfx: NONREACTIVE

## 2020-12-26 LAB — VITAMIN D 25 HYDROXY (VIT D DEFICIENCY, FRACTURES): Vit D, 25-Hydroxy: 29.5 ng/mL — ABNORMAL LOW (ref 30.0–100.0)

## 2020-12-26 LAB — TSH+FREE T4
Free T4: 1.14 ng/dL (ref 0.82–1.77)
TSH: 2.45 u[IU]/mL (ref 0.450–4.500)

## 2021-02-12 ENCOUNTER — Other Ambulatory Visit: Payer: Self-pay | Admitting: *Deleted

## 2021-03-25 IMAGING — MR MR KNEE*L* W/O CM
4 of 7 series · 16 of 40 positions shown · non-contrast
Comparison: Plain films left knee 06/12/2019.

CLINICAL DATA: Left knee pain for 3 years since a twisting injury
playing basketball. Initial encounter.

EXAM:
MRI OF THE LEFT KNEE WITHOUT CONTRAST
TECHNIQUE: Multiplanar, multisequence MR imaging of the knee was performed. No
intravenous contrast was administered.

[Series 3: t2fs axial · axial · 4.0mm · 0.25mm/px · z∈[-89,+46]mm · 4 of 28 slices shown]
[im 1/28]
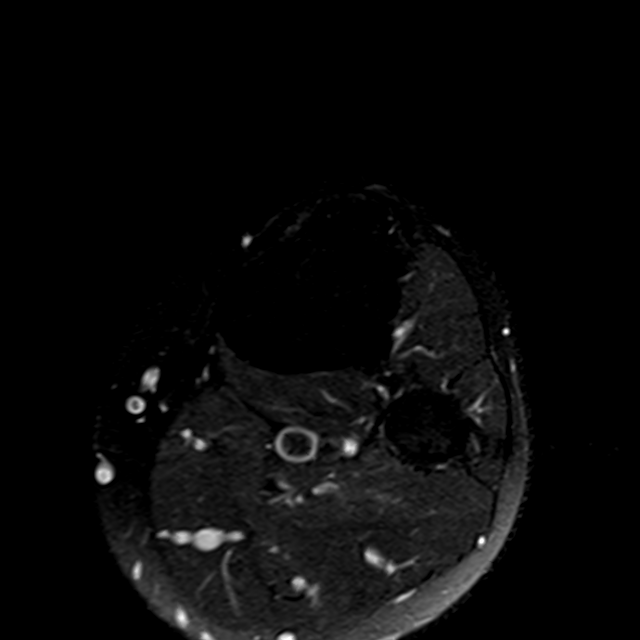
[im 7/28]
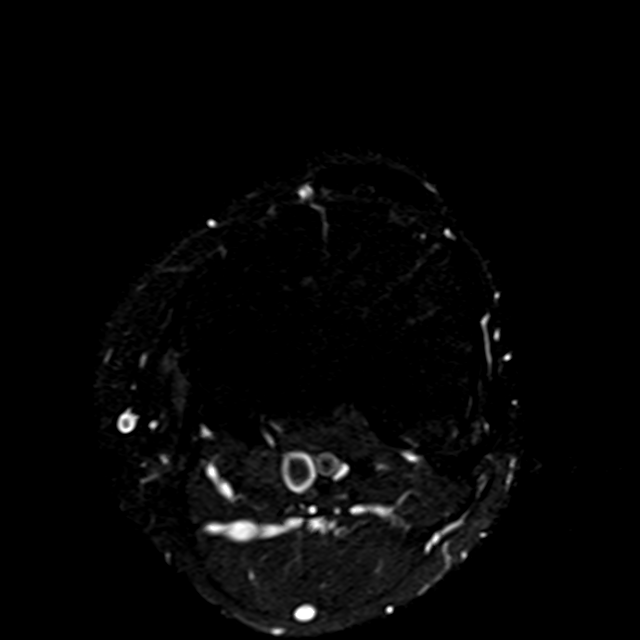
[im 14/28]
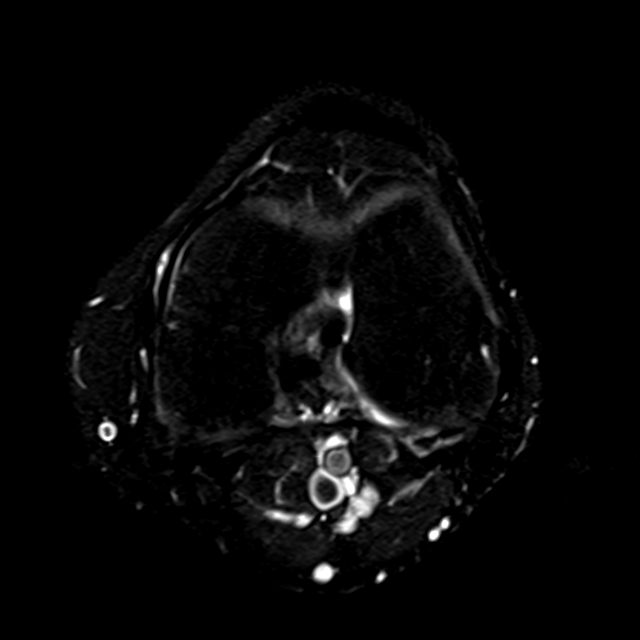
[im 28/28]
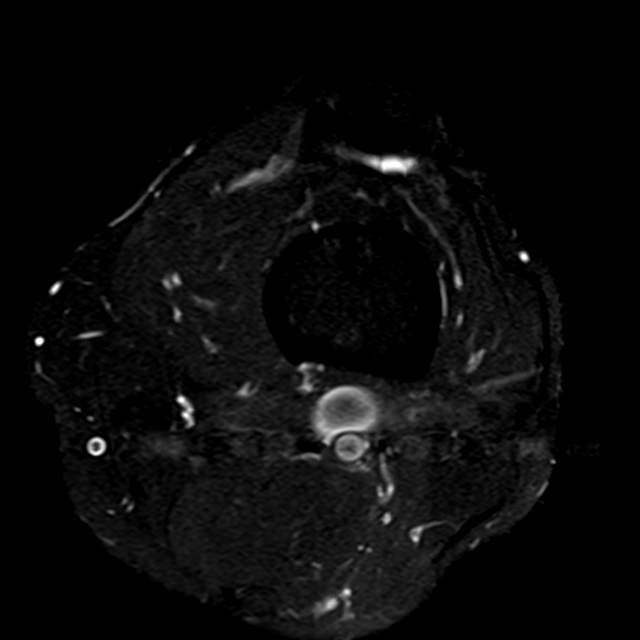

[Series 5: pdfs sag · sagittal · 3.0mm · 0.26mm/px · 3 of 29 slices shown]
[im 6/29]
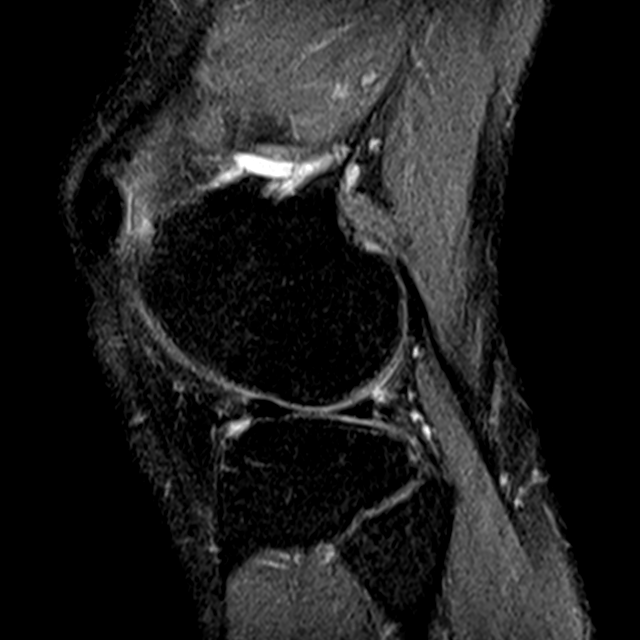
[im 17/29]
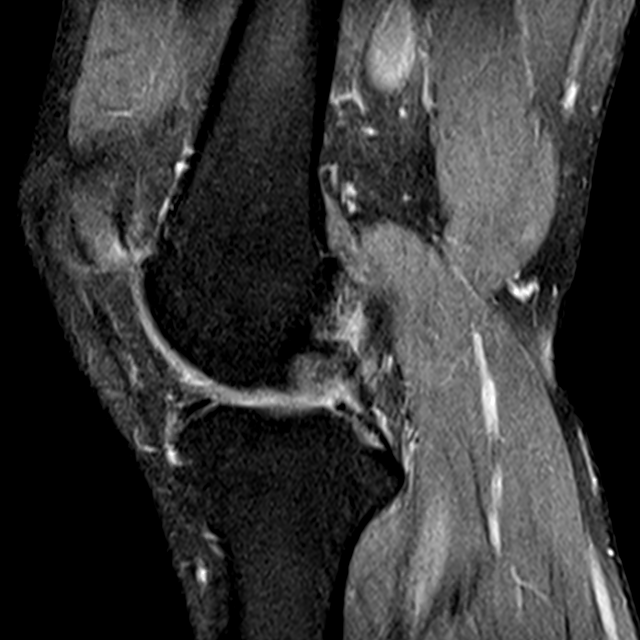
[im 29/29]
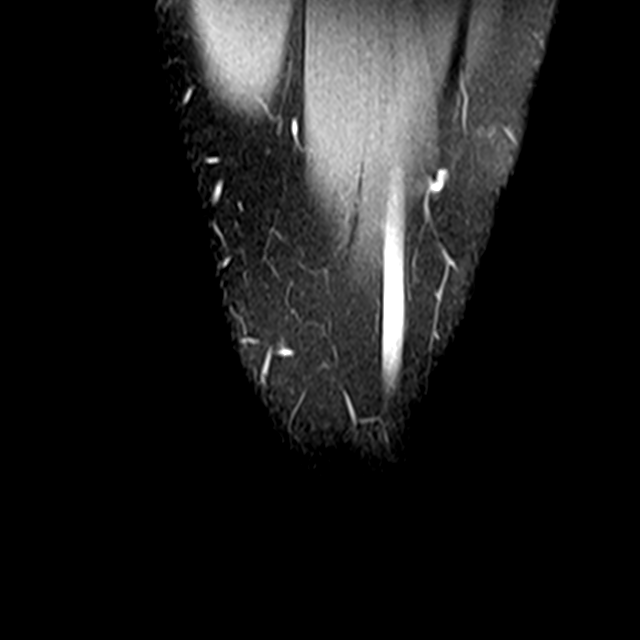

[Series 6: T1 · coronal · 4.0mm · 0.31mm/px · 6 of 30 slices shown]
[im 1/30]
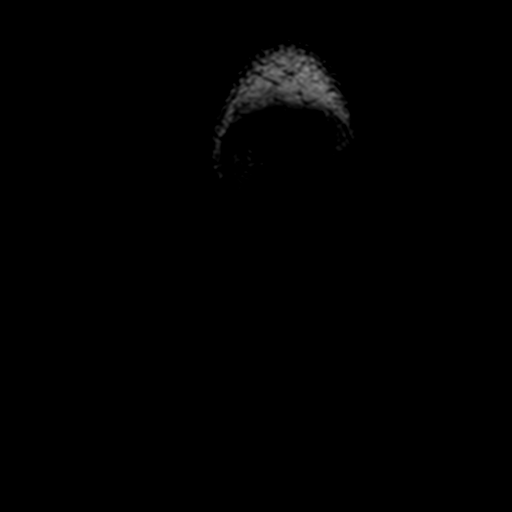
[im 6/30]
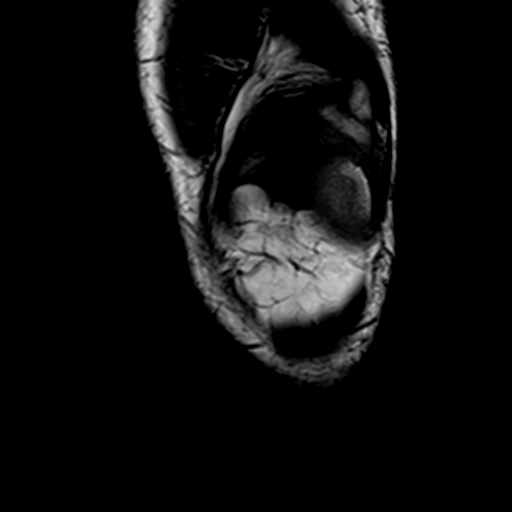
[im 12/30]
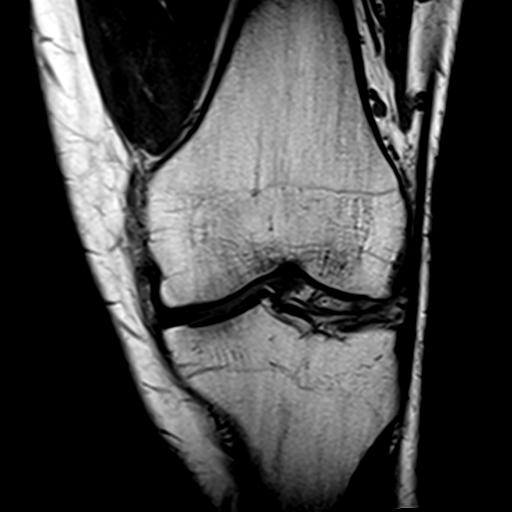
[im 18/30]
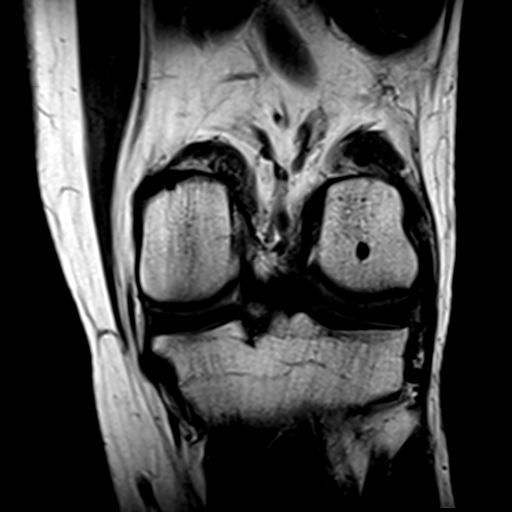
[im 24/30]
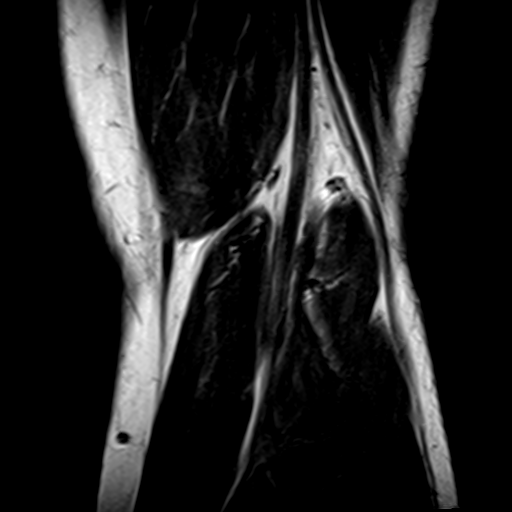
[im 30/30]
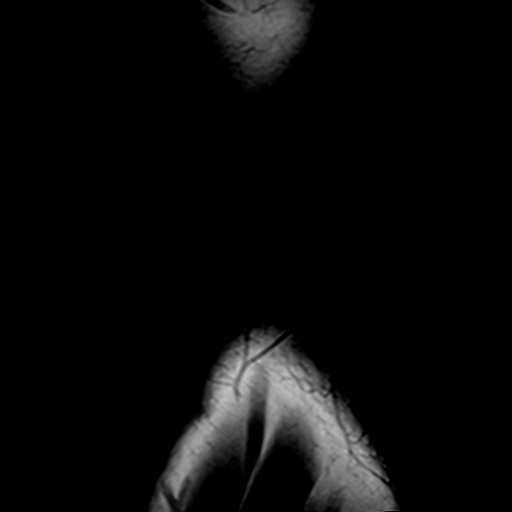

[Series 7: t2fs sag · sagittal · 3.0mm · 0.32mm/px · 3 of 29 slices shown]
[im 6/29]
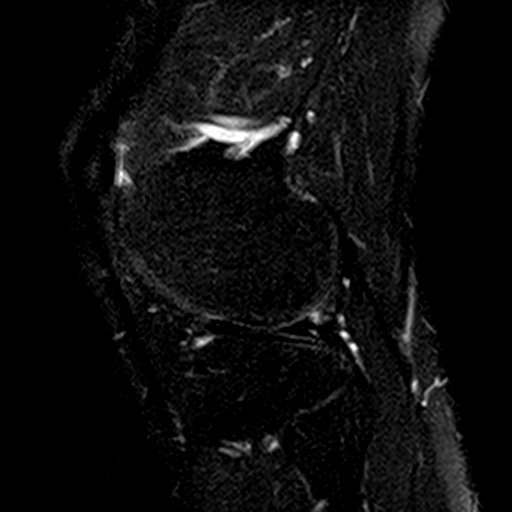
[im 17/29]
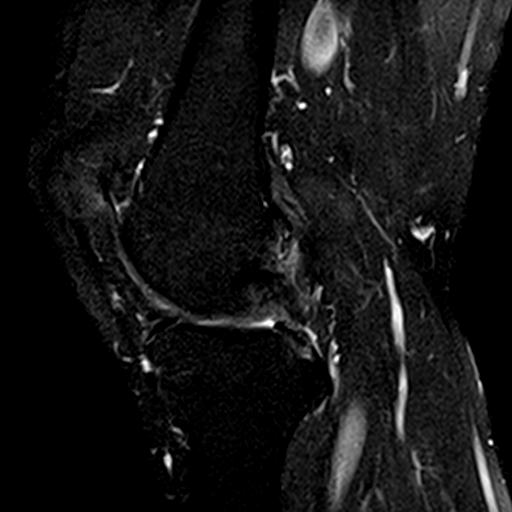
[im 29/29]
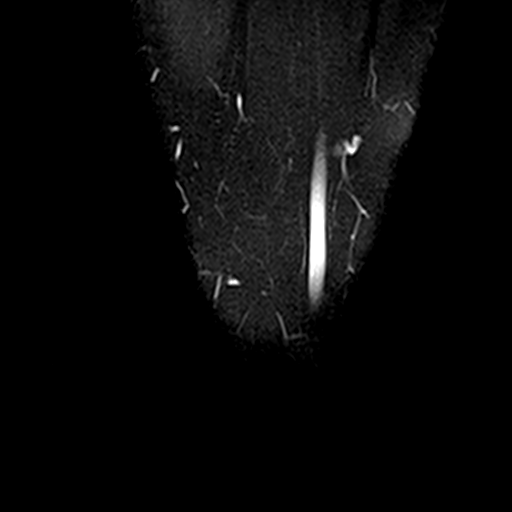

[16 of 40 positions shown; findings below may reference images not displayed]

FINDINGS: MENISCI

Medial meniscus:  Intact.

Lateral meniscus: There is a horizontal tear in the posterior horn
reaching the tibial articular surface. A second horizontal tear at
the junction of the anterior horn and body reaches the femoral
articular surface.

LIGAMENTS

Cruciates:  Intact.

Collaterals:  Intact.

CARTILAGE

Patellofemoral:  Mildly degenerated about the femoral trochlea.

Medial:  Normal.

Lateral:  Normal.

Joint:  Very small effusion.

Popliteal Fossa:  No Baker's cyst.

Extensor Mechanism:  Intact.

Bones: Small focus of subchondral edema is seen at the superior
aspect of the central femoral trochlea.

Other: None.
IMPRESSION: 1. Horizontal tear posterior horn lateral meniscus reaches the
tibial articular surface. A second horizontal tear at the junction
of the anterior horn and body reaches the femoral articular surface.
2. Mild patellofemoral osteoarthritis.

## 2021-06-25 ENCOUNTER — Other Ambulatory Visit: Payer: Self-pay

## 2021-06-25 ENCOUNTER — Encounter (INDEPENDENT_AMBULATORY_CARE_PROVIDER_SITE_OTHER): Payer: Self-pay

## 2021-06-25 ENCOUNTER — Encounter: Payer: Self-pay | Admitting: Internal Medicine

## 2021-06-25 ENCOUNTER — Ambulatory Visit (INDEPENDENT_AMBULATORY_CARE_PROVIDER_SITE_OTHER): Payer: BC Managed Care – PPO | Admitting: Internal Medicine

## 2021-06-25 VITALS — BP 128/76 | HR 57 | Resp 18 | Ht 72.0 in | Wt 207.1 lb

## 2021-06-25 DIAGNOSIS — Z0001 Encounter for general adult medical examination with abnormal findings: Secondary | ICD-10-CM

## 2021-06-25 DIAGNOSIS — R001 Bradycardia, unspecified: Secondary | ICD-10-CM | POA: Diagnosis not present

## 2021-06-25 DIAGNOSIS — E559 Vitamin D deficiency, unspecified: Secondary | ICD-10-CM

## 2021-06-25 DIAGNOSIS — G4733 Obstructive sleep apnea (adult) (pediatric): Secondary | ICD-10-CM

## 2021-06-25 DIAGNOSIS — Q134 Other congenital corneal malformations: Secondary | ICD-10-CM

## 2021-06-25 NOTE — Assessment & Plan Note (Signed)
Physical exam as documented. Fasting blood tests ordered. Up-to-date with COVID and flu vaccine.

## 2021-06-25 NOTE — Assessment & Plan Note (Signed)
Follows up with Ophthalmology Has had b/l cataract surgery Has right eye strabismus 

## 2021-06-25 NOTE — Progress Notes (Signed)
Established Patient Office Visit  Subjective:  Patient ID: William Mcclure, male    DOB: May 13, 1986  Age: 36 y.o. MRN: [id number]  CC:  Chief Complaint  Patient presents with   Annual Exam    Annual exam     HPI William Mcclure is a 36 y.o. male with past medical history of OSA, [name], b/l cataract s/p surgery and right eye strabismus who presents for annual physical.  OSA: Has been using CPAP regularly. Has some difficulty with mask at times, but overall tolerates it.  He has mild bradycardia at times.  He rides mountain bicycles and used to play competitive sports in the past.  No EKG available to review.  He denies any dizziness or palpitations.  He has had 3 doses of COVID-vaccine.  Past Medical History:  Diagnosis Date   Cataract    Glaucoma     Past Surgical History:  Procedure Laterality Date   CATARACT EXTRACTION, BILATERAL      Family History  Problem Relation Age of Onset   Healthy Mother    Cancer Father    Hyperlipidemia Father     Social History   Socioeconomic History   Marital status: Married    Spouse name: Not on file   Number of children: Not on file   Years of education: Not on file   Highest education level: Not on file  Occupational History   Not on file  Tobacco Use   Smoking status: Never   Smokeless tobacco: Never  Substance and Sexual Activity   Alcohol use: Yes    Comment: Occasionally   Drug use: Never   Sexual activity: Yes  Other Topics Concern   Not on file  Social History Narrative   Not on file   Social Determinants of Health   Financial Resource Strain: Not on file  Food Insecurity: Not on file  Transportation Needs: Not on file  Physical Activity: Not on file  Stress: Not on file  Social Connections: Not on file  Intimate Partner Violence: Not on file    No outpatient medications prior to visit.   No facility-administered medications prior to visit.    No Known Allergies  ROS Review of  Systems  Constitutional:  Negative for chills and fever.  HENT:  Negative for congestion and sore throat.   Eyes:  Negative for pain and discharge.  Respiratory:  Negative for cough and shortness of breath.   Cardiovascular:  Negative for chest pain and palpitations.  Gastrointestinal:  Negative for constipation, diarrhea, nausea and vomiting.  Endocrine: Negative for polydipsia and polyuria.  Genitourinary:  Negative for dysuria and hematuria.  Musculoskeletal:  Negative for neck pain and neck stiffness.  Skin:  Negative for rash.  Neurological:  Negative for dizziness, weakness, numbness and headaches.  Psychiatric/Behavioral:  Negative for agitation and behavioral problems.      Objective:    Physical Exam Vitals reviewed.  Constitutional:      General: He is not in acute distress.    Appearance: He is not diaphoretic.  HENT:     Head: Normocephalic and atraumatic.     Nose: Nose normal.     Mouth/Throat:     Mouth: Mucous membranes are moist.  Eyes:     General: No scleral icterus.    Pupils: Pupils are equal, round, and reactive to light.     Comments: Right eye strabismus  Cardiovascular:     Rate and Rhythm: Normal rate and regular rhythm.  Pulses: Normal pulses.     Heart sounds: Normal heart sounds. No murmur heard. Pulmonary:     Breath sounds: Normal breath sounds. No wheezing or rales.  Abdominal:     Palpations: Abdomen is soft.     Tenderness: There is no abdominal tenderness.  Musculoskeletal:     Cervical back: Neck supple. No tenderness.     Right lower leg: No edema.     Left lower leg: No edema.  Skin:    General: Skin is warm.     Findings: No rash.  Neurological:     General: No focal deficit present.     Mental Status: He is alert and oriented to person, place, and time.     Cranial Nerves: No cranial nerve deficit.     Sensory: No sensory deficit.     Motor: No weakness.  Psychiatric:        Mood and Affect: Mood normal.         Behavior: Behavior normal.    BP 128/76 (BP Location: Right Arm, Patient Position: Sitting, Cuff Size: Normal)    Pulse (!) 57    Resp 18    Ht 6' (1.829 m)    Wt 207 lb 1.3 oz (93.9 kg)    SpO2 95%    BMI 28.09 kg/m  Wt Readings from Last 3 Encounters:  06/25/21 207 lb 1.3 oz (93.9 kg)  12/22/20 221 lb 12.8 oz (100.6 kg)  05/27/20 219 lb 6.4 oz (99.5 kg)    Lab Results  Component Value Date   TSH 2.450 12/24/2020   Lab Results  Component Value Date   WBC 5.6 12/24/2020   HGB 15.9 12/24/2020   HCT 46.9 12/24/2020   MCV 90 12/24/2020   PLT 217 12/24/2020   Lab Results  Component Value Date   NA 144 12/24/2020   K 4.5 12/24/2020   CO2 19 (L) 12/24/2020   GLUCOSE 96 12/24/2020   BUN 19 12/24/2020   CREATININE 0.98 12/24/2020   BILITOT 0.3 12/24/2020   ALKPHOS 80 12/24/2020   AST 25 12/24/2020   ALT 29 12/24/2020   PROT 7.0 12/24/2020   ALBUMIN 4.4 12/24/2020   CALCIUM 9.1 12/24/2020   EGFR 102 12/24/2020   Lab Results  Component Value Date   CHOL 130 12/24/2020   Lab Results  Component Value Date   HDL 42 12/24/2020   Lab Results  Component Value Date   LDLCALC 70 12/24/2020   Lab Results  Component Value Date   TRIG 94 12/24/2020   Lab Results  Component Value Date   CHOLHDL 3.1 12/24/2020   Lab Results  Component Value Date   HGBA1C 5.6 12/24/2020      Assessment & Plan:   Problem List Items Addressed This Visit       Encounter for general adult medical examination with abnormal findings - Primary   Physical exam as documented. Fasting blood tests ordered. Up-to-date with COVID and flu vaccine.     Relevant Orders  TSH  Hemoglobin A1c  CMP14+EGFR  CBC with Differential/Platelet  Lipid Profile    Respiratory   Obstructive sleep apnea of adult    Uses CPAP regularly        Other   [name]    Follows up with Ophthalmology Has had b/l cataract surgery Has right eye strabismus      Bradycardia    Asymptomatic, likely  due to heavy endurance exercises Will check baseline EKG today  Relevant Orders   EKG 12-Lead (Completed)   Other Visit Diagnoses     Vitamin D deficiency       Relevant Orders   Vitamin D (25 hydroxy)       No orders of the defined types were placed in this encounter.   Follow-up: Return in about 1 year (around 06/25/2022) for Annual physical.    Lindell Spar, MD

## 2021-06-25 NOTE — Assessment & Plan Note (Signed)
Uses CPAP regularly °

## 2021-06-25 NOTE — Patient Instructions (Signed)
Please start taking Vitamin D 2000 IU once daily.

## 2021-06-25 NOTE — Assessment & Plan Note (Addendum)
Asymptomatic, likely due to heavy endurance exercises Will check baseline EKG today  EKG: Sinus bradycardia. HR 50. Intervals wnl.

## 2022-06-28 ENCOUNTER — Encounter: Payer: Self-pay | Admitting: Internal Medicine

## 2022-06-28 ENCOUNTER — Encounter: Payer: BC Managed Care – PPO | Admitting: Internal Medicine

## 2022-07-21 ENCOUNTER — Ambulatory Visit (INDEPENDENT_AMBULATORY_CARE_PROVIDER_SITE_OTHER): Payer: BC Managed Care – PPO | Admitting: Internal Medicine

## 2022-07-21 ENCOUNTER — Encounter: Payer: Self-pay | Admitting: Internal Medicine

## 2022-07-21 VITALS — BP 123/74 | HR 52 | Ht 72.0 in | Wt 214.2 lb

## 2022-07-21 DIAGNOSIS — G4733 Obstructive sleep apnea (adult) (pediatric): Secondary | ICD-10-CM | POA: Diagnosis not present

## 2022-07-21 DIAGNOSIS — R001 Bradycardia, unspecified: Secondary | ICD-10-CM

## 2022-07-21 DIAGNOSIS — Q134 Other congenital corneal malformations: Secondary | ICD-10-CM | POA: Diagnosis not present

## 2022-07-21 DIAGNOSIS — Z0001 Encounter for general adult medical examination with abnormal findings: Secondary | ICD-10-CM | POA: Diagnosis not present

## 2022-07-21 LAB — CMP14+EGFR
ALT: 28 IU/L (ref 0–44)
AST: 30 IU/L (ref 0–40)
Albumin/Globulin Ratio: 1.7 (ref 1.2–2.2)
Albumin: 4.2 g/dL (ref 4.1–5.1)
Alkaline Phosphatase: 64 IU/L (ref 44–121)
BUN/Creatinine Ratio: 15 (ref 9–20)
BUN: 14 mg/dL (ref 6–20)
Bilirubin Total: 0.6 mg/dL (ref 0.0–1.2)
CO2: 22 mmol/L (ref 20–29)
Calcium: 8.2 mg/dL — ABNORMAL LOW (ref 8.7–10.2)
Chloride: 103 mmol/L (ref 96–106)
Creatinine, Ser: 0.93 mg/dL (ref 0.76–1.27)
Globulin, Total: 2.5 g/dL (ref 1.5–4.5)
Glucose: 92 mg/dL (ref 70–99)
Potassium: 4 mmol/L (ref 3.5–5.2)
Sodium: 138 mmol/L (ref 134–144)
Total Protein: 6.7 g/dL (ref 6.0–8.5)
eGFR: 108 mL/min/{1.73_m2} (ref >59)

## 2022-07-21 LAB — CBC WITH DIFFERENTIAL/PLATELET
Basophils Absolute: 0 10*3/uL (ref 0.0–0.2)
Basos: 0 %
EOS (ABSOLUTE): 0.1 10*3/uL (ref 0.0–0.4)
Eos: 2 %
Hematocrit: 45.7 % (ref 37.5–51.0)
Hemoglobin: 16.2 g/dL (ref 13.0–17.7)
Immature Grans (Abs): 0 10*3/uL (ref 0.0–0.1)
Immature Granulocytes: 0 %
Lymphocytes Absolute: 0.9 10*3/uL (ref 0.7–3.1)
Lymphs: 23 %
MCH: 31.5 pg (ref 26.6–33.0)
MCHC: 35.4 g/dL (ref 31.5–35.7)
MCV: 89 fL (ref 79–97)
Monocytes Absolute: 0.7 10*3/uL (ref 0.1–0.9)
Monocytes: 18 %
Neutrophils Absolute: 2.3 10*3/uL (ref 1.4–7.0)
Neutrophils: 57 %
Platelets: 186 10*3/uL (ref 150–450)
RBC: 5.14 x10E6/uL (ref 4.14–5.80)
RDW: 11.8 % (ref 11.6–15.4)
WBC: 4.1 10*3/uL (ref 3.4–10.8)

## 2022-07-21 LAB — LIPID PANEL
Chol/HDL Ratio: 2.3 ratio (ref 0.0–5.0)
Cholesterol, Total: 106 mg/dL (ref 100–199)
HDL: 47 mg/dL (ref >39)
LDL Chol Calc (NIH): 47 mg/dL (ref 0–99)
Triglycerides: 47 mg/dL (ref 0–149)
VLDL Cholesterol Cal: 12 mg/dL (ref 5–40)

## 2022-07-21 LAB — HEMOGLOBIN A1C
Est. average glucose Bld gHb Est-mCnc: 111 mg/dL
Hgb A1c MFr Bld: 5.5 % (ref 4.8–5.6)

## 2022-07-21 LAB — VITAMIN D 25 HYDROXY (VIT D DEFICIENCY, FRACTURES): Vit D, 25-Hydroxy: 50.3 ng/mL (ref 30.0–100.0)

## 2022-07-21 LAB — TSH: TSH: 1.12 u[IU]/mL (ref 0.450–4.500)

## 2022-07-21 NOTE — Assessment & Plan Note (Addendum)
Physical exam as documented. Fasting blood tests ordered. Denies flu vaccine.

## 2022-07-21 NOTE — Assessment & Plan Note (Signed)
Uses CPAP regularly °

## 2022-07-21 NOTE — Progress Notes (Signed)
Established Patient Office Visit  Subjective:  Patient ID: William Mcclure, male    DOB: 1985-10-25  Age: 37 y.o. MRN: [id number]  CC:  Chief Complaint  Patient presents with   Annual Exam    HPI William Mcclure is a 37 y.o. male with past medical history of OSA, [name], b/l cataract s/p surgery and right eye strabismus who presents for annual physical.  OSA: Has been using CPAP regularly. Has some difficulty with mask at times, but overall tolerates it.  He has mild bradycardia at times.  He rides mountain bicycles and used to play competitive sports in the past.  No EKG available to review.  He denies any dizziness or palpitations.  He has brought physical exam form for semen donation for surrogacy.  Form filled out and provided to the patient today.  He has had 3 doses of COVID-vaccine.  Past Medical History:  Diagnosis Date   Cataract    Glaucoma     Past Surgical History:  Procedure Laterality Date   CATARACT EXTRACTION, BILATERAL      Family History  Problem Relation Age of Onset   Healthy Mother    Cancer Father    Hyperlipidemia Father     Social History   Socioeconomic History   Marital status: Married    Spouse name: Not on file   Number of children: Not on file   Years of education: Not on file   Highest education level: Not on file  Occupational History   Not on file  Tobacco Use   Smoking status: Never   Smokeless tobacco: Never  Substance and Sexual Activity   Alcohol use: Yes    Comment: Occasionally   Drug use: Never   Sexual activity: Yes  Other Topics Concern   Not on file  Social History Narrative   Not on file   Social Determinants of Health   Financial Resource Strain: Not on file  Food Insecurity: Not on file  Transportation Needs: Not on file  Physical Activity: Not on file  Stress: Not on file  Social Connections: Not on file  Intimate Partner Violence: Not on file    No outpatient medications prior to  visit.   No facility-administered medications prior to visit.    No Known Allergies  ROS Review of Systems  Constitutional:  Negative for chills and fever.  HENT:  Negative for congestion and sore throat.   Eyes:  Negative for pain and discharge.  Respiratory:  Negative for cough and shortness of breath.   Cardiovascular:  Negative for chest pain and palpitations.  Gastrointestinal:  Negative for constipation, diarrhea, nausea and vomiting.  Endocrine: Negative for polydipsia and polyuria.  Genitourinary:  Negative for dysuria and hematuria.  Musculoskeletal:  Negative for neck pain and neck stiffness.  Skin:  Negative for rash.  Neurological:  Negative for dizziness, weakness, numbness and headaches.  Psychiatric/Behavioral:  Negative for agitation and behavioral problems.       Objective:    Physical Exam Vitals reviewed.  Constitutional:      General: He is not in acute distress.    Appearance: He is not diaphoretic.  HENT:     Head: Normocephalic and atraumatic.     Nose: Nose normal.     Mouth/Throat:     Mouth: Mucous membranes are moist.  Eyes:     General: No scleral icterus.    Pupils: Pupils are equal, round, and reactive to light.     Comments: Right eye  strabismus  Cardiovascular:     Rate and Rhythm: Normal rate and regular rhythm.     Pulses: Normal pulses.     Heart sounds: Normal heart sounds. No murmur heard. Pulmonary:     Breath sounds: Normal breath sounds. No wheezing or rales.  Abdominal:     Palpations: Abdomen is soft.     Tenderness: There is no abdominal tenderness.  Musculoskeletal:     Cervical back: Neck supple. No tenderness.     Right lower leg: No edema.     Left lower leg: No edema.  Skin:    General: Skin is warm.     Findings: No rash.  Neurological:     General: No focal deficit present.     Mental Status: He is alert and oriented to person, place, and time.     Cranial Nerves: No cranial nerve deficit.     Sensory: No  sensory deficit.     Motor: No weakness.  Psychiatric:        Mood and Affect: Mood normal.        Behavior: Behavior normal.     BP 123/74 (BP Location: Right Arm, Patient Position: Sitting, Cuff Size: Large)   Pulse (!) 52   Ht 6' (1.829 m)   Wt 214 lb 3.2 oz (97.2 kg)   SpO2 94%   BMI 29.05 kg/m  Wt Readings from Last 3 Encounters:  07/21/22 214 lb 3.2 oz (97.2 kg)  06/25/21 207 lb 1.3 oz (93.9 kg)  12/22/20 221 lb 12.8 oz (100.6 kg)    Lab Results  Component Value Date   TSH 1.120 07/20/2022   Lab Results  Component Value Date   WBC 4.1 07/20/2022   HGB 16.2 07/20/2022   HCT 45.7 07/20/2022   MCV 89 07/20/2022   PLT 186 07/20/2022   Lab Results  Component Value Date   NA 138 07/20/2022   K 4.0 07/20/2022   CO2 22 07/20/2022   GLUCOSE 92 07/20/2022   BUN 14 07/20/2022   CREATININE 0.93 07/20/2022   BILITOT 0.6 07/20/2022   ALKPHOS 64 07/20/2022   AST 30 07/20/2022   ALT 28 07/20/2022   PROT 6.7 07/20/2022   ALBUMIN 4.2 07/20/2022   CALCIUM 8.2 (L) 07/20/2022   EGFR 108 07/20/2022   Lab Results  Component Value Date   CHOL 106 07/20/2022   Lab Results  Component Value Date   HDL 47 07/20/2022   Lab Results  Component Value Date   LDLCALC 47 07/20/2022   Lab Results  Component Value Date   TRIG 47 07/20/2022   Lab Results  Component Value Date   CHOLHDL 2.3 07/20/2022   Lab Results  Component Value Date   HGBA1C 5.5 07/20/2022      Assessment & Plan:   Problem List Items Addressed This Visit    Obstructive sleep apnea of adult Uses CPAP regularly  Encounter for general adult medical examination with abnormal findings Physical exam as documented. Fasting blood tests ordered. Denies flu vaccine.  [name] Follows up with Ophthalmology Has had b/l cataract surgery Has right eye strabismus  Bradycardia Asymptomatic, likely due to heavy endurance exercises  EKG (02/23): Sinus bradycardia. HR 50. Intervals wnl.   No  orders of the defined types were placed in this encounter.   Follow-up: No follow-ups on file.    Lindell Spar, MD

## 2022-07-21 NOTE — Assessment & Plan Note (Signed)
Follows up with Ophthalmology Has had b/l cataract surgery Has right eye strabismus

## 2022-07-21 NOTE — Assessment & Plan Note (Signed)
Asymptomatic, likely due to heavy endurance exercises  EKG (02/23): Sinus bradycardia. HR 50. Intervals wnl.

## 2023-06-28 ENCOUNTER — Other Ambulatory Visit: Payer: Self-pay

## 2023-06-28 ENCOUNTER — Ambulatory Visit: Payer: No Typology Code available for payment source | Attending: Family Medicine | Admitting: FAMILY MEDICINE

## 2023-06-28 ENCOUNTER — Encounter (HOSPITAL_BASED_OUTPATIENT_CLINIC_OR_DEPARTMENT_OTHER): Payer: Self-pay | Admitting: FAMILY MEDICINE

## 2023-06-28 VITALS — BP 130/76 | HR 79 | Temp 97.6°F | Resp 16 | Ht 72.0 in | Wt 219.8 lb

## 2023-06-28 DIAGNOSIS — Z6829 Body mass index (BMI) 29.0-29.9, adult: Secondary | ICD-10-CM

## 2023-06-28 DIAGNOSIS — K625 Hemorrhage of anus and rectum: Secondary | ICD-10-CM | POA: Insufficient documentation

## 2023-06-28 DIAGNOSIS — E663 Overweight: Secondary | ICD-10-CM | POA: Insufficient documentation

## 2023-06-28 DIAGNOSIS — G4730 Sleep apnea, unspecified: Secondary | ICD-10-CM | POA: Insufficient documentation

## 2023-06-28 DIAGNOSIS — Z9989 Dependence on other enabling machines and devices: Secondary | ICD-10-CM

## 2023-06-28 DIAGNOSIS — Z13220 Encounter for screening for lipoid disorders: Secondary | ICD-10-CM | POA: Insufficient documentation

## 2023-06-28 DIAGNOSIS — Z130 Encounter for screening for diseases of the blood and blood-forming organs and certain disorders involving the immune mechanism: Secondary | ICD-10-CM | POA: Insufficient documentation

## 2023-06-28 LAB — COMPREHENSIVE METABOLIC PANEL, NON-FASTING
ALBUMIN: 4.1 g/dL (ref 3.5–5.0)
ALKALINE PHOSPHATASE: 79 U/L (ref 45–115)
ALT (SGPT): 43 U/L (ref 10–55)
ANION GAP: 6 mmol/L (ref 4–13)
AST (SGOT): 28 U/L (ref 8–45)
BILIRUBIN TOTAL: 0.6 mg/dL (ref 0.3–1.3)
BUN/CREA RATIO: 20 (ref 6–22)
BUN: 18 mg/dL (ref 8–25)
CALCIUM: 8.9 mg/dL (ref 8.6–10.2)
CHLORIDE: 109 mmol/L (ref 96–111)
CO2 TOTAL: 24 mmol/L (ref 22–30)
CREATININE: 0.88 mg/dL (ref 0.75–1.35)
ESTIMATED GFR - MALE: >90 mL/min/BSA (ref >=60)
GLUCOSE: 90 mg/dL (ref 65–125)
POTASSIUM: 4.3 mmol/L (ref 3.5–5.1)
PROTEIN TOTAL: 7.4 g/dL (ref 6.4–8.3)
SODIUM: 139 mmol/L (ref 136–145)

## 2023-06-28 LAB — CBC WITH DIFF
BASOPHIL #: <0.1 10*3/uL (ref <=0.20)
BASOPHIL %: 0.3 %
EOSINOPHIL #: 0.11 10*3/uL (ref <=0.50)
EOSINOPHIL %: 1.7 %
HCT: 45.5 % (ref 38.9–52.0)
HGB: 15.9 g/dL (ref 13.4–17.5)
IMMATURE GRANULOCYTE #: <0.1 10*3/uL (ref <0.10)
IMMATURE GRANULOCYTE %: 0.5 % (ref 0.0–1.0)
LYMPHOCYTE #: 1.47 10*3/uL (ref 1.00–4.80)
LYMPHOCYTE %: 22.3 %
MCH: 30.7 pg (ref 26.0–32.0)
MCHC: 34.9 g/dL (ref 31.0–35.5)
MCV: 87.8 fL (ref 78.0–100.0)
MONOCYTE #: 0.64 10*3/uL (ref 0.20–1.10)
MONOCYTE %: 9.7 %
MPV: 9.2 fL (ref 8.7–12.5)
NEUTROPHIL #: 4.31 10*3/uL (ref 1.50–7.70)
NEUTROPHIL %: 65.5 %
PLATELETS: 310 10*3/uL (ref 150–400)
RBC: 5.18 10*6/uL (ref 4.50–6.10)
RDW-CV: 11.9 % (ref 11.5–15.5)
WBC: 6.6 10*3/uL (ref 3.7–11.0)

## 2023-06-28 LAB — LIPID PANEL
CHOL/HDL RATIO: 3.8
CHOLESTEROL: 161 mg/dL (ref 100–200)
HDL CHOL: 42 mg/dL — ABNORMAL LOW (ref >=50)
LDL CALC: 103 mg/dL — ABNORMAL HIGH (ref <100)
NON-HDL: 119 mg/dL (ref <=190)
TRIGLYCERIDES: 85 mg/dL (ref <150)
VLDL CALC: 14 mg/dL (ref <30)

## 2023-06-28 NOTE — Progress Notes (Signed)
[address]  [place] [address]  Phone: [phone]  Fax: [phone]     Family Medicine Progress Note    Name: Frederick Leon  MRN : [id number]  Date of service: 06/28/2023      History of Present Illness  Frederick Leon is a 38 y.o. male patient with a pertinent medical history of sleep apnea who had concerns including New Patient (Establish care ), Blood In  Stool (Patient states blood in stool off and on for the past 6 months. Patient is having regular BM with no pain. ), and Sleep Apnea (Patient states he has been diagnosed with sleep apnea and uses CPAP machine, no new concerns at this time. ).      Patient reports that for the past approximately 1 year he has been getting less exercise than previously and consuming an increased amount of red [place]. Patient has approximately 2-4 bowel movements daily. Over the past at least 6 months, patient has experienced bright red blood per rectum which he mostly notices on his toilet paper after he wipes. Over the same period of time, patient has been experiencing increased abdominal bloating.  Patient denies any pain with defecation, or sensations of incomplete emptying.  Patient denies any family history of colon cancer, or inflammatory bowel diseases including ulcerative colitis and Crohn's disease.  Patient denies recent weight loss, but endorses an approximately 15 lb weight gain over the past year. He also denies feeling feverish, experiencing chills, or night sweats. Patient was diagnosed with sleep apnea approximately 2 years ago for which he was prescribed a CPAP device.  He does not feel that the CPAP provides significant benefit with use. He has moved since his diagnosis and would like to establish care with a local pulmonologist. Patient underwent screening tests for hepatitis-C and HIV last spring at an outside facility and is agreeable to record retrieval.  Patient declines offer of influenza  vaccine.    ROS:   Review of Systems   Constitutional:  Positive for fatigue. Negative for activity change, appetite change, chills, diaphoresis, fever and unexpected weight change.   HENT:  Negative for congestion, hearing loss and trouble swallowing.    Respiratory:  Negative for shortness of breath.    Cardiovascular:  Negative for chest pain and leg swelling.   Gastrointestinal:  Positive for abdominal distention and anal bleeding. Negative for abdominal pain, constipation, diarrhea and vomiting.   Endocrine: Negative for polydipsia and polyphagia.   Genitourinary:  Negative for difficulty urinating.   Skin:  Negative for rash.   Neurological:  Negative for dizziness, weakness and headaches.   Psychiatric/Behavioral:  Negative for behavioral problems, confusion and suicidal ideas. The patient is not hyperactive.         Historical Data    Patient Active Problem List   Diagnosis    [name]    Strabismus    Amblyopia of right eye    Cornea scar    Cataract       Current Outpatient Medications   Medication Sig    multivit-min/folic/vit K/lycop (ONE-A-DAY MEN'S MULTIVITAMIN ORAL) Take by mouth            Objective  Vital Signs:  BP 130/76   Pulse 79   Temp 36.4 C (97.6 F) (Thermal Scan)   Resp 16   Ht 1.829 m (6')   Wt 99.7 kg (219 lb 12.8 oz)   SpO2 96%   BMI  29.81 kg/m       Physical Exam:  Physical Exam  Constitutional:       General: He is not in acute distress.     Appearance: He is not ill-appearing.   HENT:      Head: Normocephalic and atraumatic.      Right Ear: External ear normal.      Left Ear: External ear normal.      Nose: Nose normal.      Mouth/Throat:      Mouth: Mucous membranes are moist.   Eyes:      General: No scleral icterus.     Conjunctiva/sclera: Conjunctivae normal.      Comments: patient has a prosthetic right eye   Cardiovascular:      Rate and Rhythm: Normal rate and regular rhythm.   Pulmonary:      Effort: Pulmonary effort is normal.      Breath sounds: Normal breath  sounds. No wheezing, rhonchi or rales.   Abdominal:      General: Abdomen is flat. Bowel sounds are normal.      Palpations: Abdomen is soft.      Tenderness: There is no abdominal tenderness. There is no guarding.   Musculoskeletal:         General: Normal range of motion.      Cervical back: Neck supple.      Right lower leg: No edema.      Left lower leg: No edema.   Skin:     Findings: No rash.   Neurological:      General: No focal deficit present.      Mental Status: He is alert.   Psychiatric:         Mood and Affect: Mood normal.         Behavior: Behavior normal.         Thought Content: Thought content normal.         Judgment: Judgment normal.         Depression screening is negative. PHQ 2 Total: 0           Assessment and Plan  1. Bright red blood per rectum (Primary)  Given patient's symptoms, diet, and age, referral placed to [facility] for colonoscopy.  CBC ordered to assess for anemia secondary to possible blood loss.  -     Refer to [facility]; Future  -     CBC/DIFF; Future    2. Screening for deficiency anemia  CBC ordered to assess for anemia secondary to possible blood loss.  -     CBC/DIFF; Future    3. Sleep apnea, unspecified type  Patient has a CPAP device, but needs to establish with a pulmonologist for ongoing follow-up and possible adjustment of prescription as needed.  -     Refer to [facility] Pulmonology; Future    4. Overweight (BMI 25.0-29.9)  Patient had reportedly normal labs approximately 1 year ago.  Given patient's BMI of approximately 30, will obtain labs to reassess for hepatic steatosis, diabetes mellitus, electrolyte imbalance and hyperlipidemia.  -     LIPID PANEL; Future  -     HGA1C (HEMOGLOBIN A1C WITH EST AVG GLUCOSE); Future  -     COMPREHENSIVE METABOLIC PANEL, NON-FASTING; Future    5. Screening for hyperlipidemia  Lipid panel ordered to assess for hyperlipidemia.  -     LIPID PANEL; Future      Follow up: Return in about 3 months (  around 09/25/2023)  for In Person Visit with PCP Dr. Christell Constant.    Dimas Alexandria, DO 06/28/2023 13:26  [facility]  [facility]         I saw and examined the patient.  I reviewed the resident's note.  I agree with the findings and plan of care as documented in the resident's note.  Any exceptions/additions are edited/noted.    Bernarda Caffey, DO

## 2023-06-29 LAB — HGA1C (HEMOGLOBIN A1C WITH EST AVG GLUCOSE)
ESTIMATED AVERAGE GLUCOSE: 100 mg/dL
HEMOGLOBIN A1C: 5.1 % (ref 4.1–5.7)

## 2023-07-01 ENCOUNTER — Ambulatory Visit: Payer: No Typology Code available for payment source | Attending: Physician Assistant | Admitting: Physician Assistant

## 2023-07-01 ENCOUNTER — Other Ambulatory Visit: Payer: Self-pay

## 2023-07-01 ENCOUNTER — Encounter (HOSPITAL_BASED_OUTPATIENT_CLINIC_OR_DEPARTMENT_OTHER): Payer: Self-pay | Admitting: Physician Assistant

## 2023-07-01 VITALS — BP 135/84 | HR 80 | Temp 98.1°F | Resp 16 | Ht 72.0 in | Wt 226.4 lb

## 2023-07-01 DIAGNOSIS — K625 Hemorrhage of anus and rectum: Secondary | ICD-10-CM | POA: Insufficient documentation

## 2023-07-01 DIAGNOSIS — R140 Abdominal distension (gaseous): Secondary | ICD-10-CM | POA: Insufficient documentation

## 2023-07-01 NOTE — Nursing Note (Signed)
New patient here for rectal bleeding. Patient complains of bloating and increased fatigue.

## 2023-07-01 NOTE — Progress Notes (Signed)
Gastroenterology Service [place], [place] [address]    Date:   07/01/2023  Name: Frederick Leon  Age: 38 y.o.    Referring Provider:    Bernarda Caffey, DO  [address]  [address]  [place],  [place] [address]    Primary Care Provider:    Dimas Alexandria, DO    Chief Complaint: New Patient, Rectal Bleeding, Fatigue, and Other (Needs colonoscopy)  Reason for GI Consult:  Rectal Bleeding/Bloating/Fatigue    Assessment and Plan  Frederick Leon is a 38 y.o. male who presents with New Patient, Rectal Bleeding, Fatigue, and Other (Needs colonoscopy).  Past medical history is significant for Frederick's anomaly and strabismus.      Assessment/Plan   1. Bright red blood per rectum    2. Abdominal bloating        Recommendations:  Diagnostic colonoscopy ordered  CBC and CMP WNL 06/28/2023    Risks/Benefits of procedure discussed with patient?  yes - colonoscopy   Pt advised extra-intestinal positive ROS not discussed on this visit should be addressed with the primary service.  Follow up will be scheduled in the GI clinic s/p procedure.    ++++++++++++++++++++++++++++++++++++++++++++++++++++++++++++++++++++++++++++++++++++++++++++++++++++++++++++++++++  History of Present Illness  Frederick Leon is a 38 y.o. male who presents today to establish in clinic. Patient was referred to [place] by family medicine on 06/28/2023 after he noted experiencing BRBPR with wiping and increased abdominal bloating over the last 6 months. Patient reported having 2-4 bowel movements daily without pain or sensation of incomplete emptying. Denied family history of colon cancer or IBD.    Today, patient endorses the above. Blood with wiping doesn't occur every time but frequently. Stools vary in consistency. Denies melena or palpable lesion with wiping. Denies heartburn, trouble swallowing, nausea, vomiting, or other complaints. Denies tobacco use. He drinks maybe a beer a week. He only uses NSAIDs when sick but otherwise denies  use.    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     HX STRABISMUS SURGERY  1989    bil rectus muscle eye surgery     Allergies   Allergen Reactions    Nkda [No Known Drug Allergies]      Outpatient Medications  Current Outpatient Medications   Medication Sig    multivit-min/folic/vit K/lycop (ONE-A-DAY MEN'S MULTIVITAMIN ORAL) Take by mouth     Family History  Family History   Problem Relation Age of Onset    Healthy Mother     Lymphoma Father     Heart Attack Father     No Known Problems Sister     No Known Problems Brother     No Known Problems Daughter      Social History  Social History     Tobacco Use    Smoking status: Never    Smokeless tobacco: Never  Substance Use Topics    Alcohol use: Yes     Comment: socially    Drug use: No     Review of Systems  See HPI    Examination:  BP 135/84   Pulse 80   Temp 36.7 C (98.1 F) (Temporal)   Resp 16   Ht 1.829 m (6')   Wt 103 kg (226 lb 6.6 oz)   SpO2 97%   BMI 30.71 kg/m     Wt Readings from Last 2 Encounters:   07/01/23 103 kg (226 lb 6.6 oz)   06/28/23 99.7 kg (219 lb 12.8 oz)     General: no distress and vital signs reviewed  Eyes: Conjunctiva clear, sclera non-icteric.   HENT:Head atraumatic and normocephalic  Neck: No JVD or tracheal deviation  Lungs: Thoracic excursion  Normal  Neurologic: Grossly normal. Alert and oriented x3  Psychiatric: Normal    Data/Chart reviewed:  Previous labs reviewed?  yes   06/28/2023: CBC and CMP WNL, A1C 5.1, lipid panel relatively unremarkable    Previous imaging reviewed?  No  Previous biopsy and pathology/EKG/outside records?  no   Images  personally reviewed?  no  My interpretation:  N/A    The above data may have been collected via chart review, discussion with other providers, review of outside records, discussion with the pts family/friends, and or obtained by the patient.  Not all of the above information was necessarily discussed with the patient.  The above stated documentation is for the medical record and communication/coordination of care for the pts best interest.  If the patient/family has any questions about the above stated material, this can be answered at the next scheduled visit or during hospital rounds if the patient is hospitalized.    Sedalia Muta, PA-C  [facility] - Gastroenterology  [place], [place]  [address]

## 2023-07-05 ENCOUNTER — Encounter (HOSPITAL_BASED_OUTPATIENT_CLINIC_OR_DEPARTMENT_OTHER): Payer: Self-pay | Admitting: Gastroenterology

## 2023-07-05 ENCOUNTER — Encounter (HOSPITAL_COMMUNITY): Payer: Self-pay | Admitting: Gastroenterology

## 2023-07-07 ENCOUNTER — Ambulatory Visit (HOSPITAL_COMMUNITY): Payer: No Typology Code available for payment source | Admitting: Certified Registered"

## 2023-07-07 ENCOUNTER — Ambulatory Visit
Admission: RE | Admit: 2023-07-07 | Discharge: 2023-07-07 | Disposition: A | Discharge status: Home / Self Care | Payer: No Typology Code available for payment source | Source: Ambulatory Visit | Attending: Gastroenterology | Admitting: Gastroenterology

## 2023-07-07 ENCOUNTER — Encounter (HOSPITAL_COMMUNITY)
Admission: RE | Disposition: A | Discharge status: Home / Self Care | Payer: Self-pay | Source: Ambulatory Visit | Attending: Gastroenterology

## 2023-07-07 ENCOUNTER — Other Ambulatory Visit: Payer: Self-pay

## 2023-07-07 ENCOUNTER — Encounter (HOSPITAL_COMMUNITY): Payer: Self-pay | Admitting: Gastroenterology

## 2023-07-07 DIAGNOSIS — K635 Polyp of colon: Secondary | ICD-10-CM

## 2023-07-07 DIAGNOSIS — K648 Other hemorrhoids: Secondary | ICD-10-CM

## 2023-07-07 DIAGNOSIS — Z9989 Dependence on other enabling machines and devices: Secondary | ICD-10-CM | POA: Insufficient documentation

## 2023-07-07 DIAGNOSIS — K625 Hemorrhage of anus and rectum: Secondary | ICD-10-CM

## 2023-07-07 DIAGNOSIS — G4730 Sleep apnea, unspecified: Secondary | ICD-10-CM | POA: Insufficient documentation

## 2023-07-07 HISTORY — DX: Presence of spectacles and contact lenses: Z97.3

## 2023-07-07 HISTORY — DX: Sleep apnea, unspecified: G47.30

## 2023-07-07 HISTORY — DX: Dependence on other enabling machines and devices: Z99.89

## 2023-07-07 SURGERY — COLONOSCOPY
Anesthesia: General | Wound class: Clean Contaminated Wounds-The respiratory, GI, Genital, or urinary

## 2023-07-07 MED ORDER — LIDOCAINE (PF) 100 MG/5 ML (2 %) INTRAVENOUS SYRINGE
INJECTION | Freq: Once | INTRAVENOUS | Status: DC | PRN
Start: 2023-07-07 — End: 2023-07-07
  Administered 2023-07-07: 100 mg via INTRAVENOUS

## 2023-07-07 MED ORDER — ONDANSETRON HCL (PF) 4 MG/2 ML INJECTION SOLUTION
Freq: Once | INTRAMUSCULAR | Status: DC | PRN
Start: 2023-07-07 — End: 2023-07-07
  Administered 2023-07-07: 4 mg via INTRAVENOUS

## 2023-07-07 MED ORDER — PROPOFOL 10 MG/ML IV BOLUS
INJECTION | Freq: Once | INTRAVENOUS | Status: DC | PRN
Start: 2023-07-07 — End: 2023-07-07
  Administered 2023-07-07: 100 mg via INTRAVENOUS
  Administered 2023-07-07: 50 mg via INTRAVENOUS
  Administered 2023-07-07 (×5): 100 mg via INTRAVENOUS
  Administered 2023-07-07: 50 mg via INTRAVENOUS

## 2023-07-07 MED ORDER — LACTATED RINGERS INTRAVENOUS SOLUTION
INTRAVENOUS | Status: DC
Start: 2023-07-07 — End: 2023-07-07

## 2023-07-07 SURGICAL SUPPLY — 23 items
BRUSH CYTO 1.5MM 140CM BLT TIP ERG HNDL ATO STP SHEATH CLBR STRL DISP PTFE BRONCHSCP (ENDOSCOPIC SUPPLIES) IMPLANT
CATH ELHMST INJ GLD PROBE 7FR 25GA .24MM 210CM BIPOLAR RND DIST TIP STD CONN INTGR DISP 2.8MM MN WRK (ENDOSCOPIC SUPPLIES) IMPLANT
CLIP HMST MR CONDITIONAL BRD CATH ROT CONTROL KNOB NO SHEATH RSL 360 235CM 2.8MM 11MM OPN (ENDOSCOPIC SUPPLIES) IMPLANT
DISCONTINUED USE ITEM 328361 - JELLY LUB EZ BCTRST H2O SOL NG_RS FLPTP TUBE STRL 2OZ LF (MED SURG SUPPLIES) IMPLANT
FORCEPS BIOPSY HOT 240CM 2.2MM RJ 4 +2.8MM DISPO (ENDOSCOPIC SUPPLIES) IMPLANT
FORCEPS BIOPSY LRG CPC NEEDLE 240CM 2.2MM RJ 3 DISP ORNG (ENDOSCOPIC SUPPLIES) IMPLANT
FORCEPS BIOPSY NEEDLE 160CM 1.8MM RJ 4 DISP YW 2MM WRK CHNL GSPED (MED SURG SUPPLIES) IMPLANT
FORCEPS BIOPSY NEEDLE 240CM RJ 4 JMB (ENDOSCOPIC SUPPLIES) IMPLANT
GW ENDOSCOPIC .035IN 260CM DREAMWIRE ANG RX EGLD NITINOL BIL STRL DISP (ENDOSCOPIC SUPPLIES) IMPLANT
JELLY LUB EZ BCTRST H2O SOL NG_RS FLPTP TUBE STRL 2OZ LF (MED SURG SUPPLIES) IMPLANT
KIT RM TURNOVER STPC DISP (DRAPE/PACKS/SHEETS/OR TOWEL) IMPLANT
LIGATOR 2.8MM 8.6-11.5MM SSS7 ESOPH 1 STNG MLT BAND HNDL STRL DISP ENDOS HMSTS LF (ENDOSCOPIC SUPPLIES) IMPLANT
MARKER ENDOS SPOT EX PERM IND DRK SYRG 5ML (MED SURG SUPPLIES) IMPLANT
NEEDLE SCLRTX 25GA 2.3MM BVL STRL DISP STAR CATH INTJCT 4MM 240CM (ENDOSCOPIC SUPPLIES) IMPLANT
NET SPEC RETR 230CM 2.5MM RTHNT STD SHEATH 6X3CM NONST LF  DISP (ENDOSCOPIC SUPPLIES) IMPLANT
PROBE ESURG 220CM 2.3MM FIAPC FLXB STR FIRE STRL DISP (SURGICAL CUTTING SUPPLIES) IMPLANT
SNARE 230CM 2.5MM 3CM PLPK ROTR RTHNT ENDOS NONST LF  DISP (ENDOSCOPIC SUPPLIES) IMPLANT
SNARE MED OVAL 240CM 2.4MM CAPTIVATR STF ENDOS PLYP 27MM DISP (ENDOSCOPIC SUPPLIES) IMPLANT
SNARE RND 240CM 2.4MM CAPTIVATR COLD STF THN WRE ENDOS PLPCTM 10MM DISP (ENDOSCOPIC SUPPLIES) ×1 IMPLANT
SOCK CAN MEDIVAC ASCP SPECI CNVRT NONST LF (MED SURG SUPPLIES) IMPLANT
STENT WALLFLEX 25-30MM 10FR 90MM 230CM COLON DEL SYS NITINOL 270CM STRL ACPT .035IN GW (STENTS GASTROINTESTINAL)
TRAP INLN SUCT CHAMBER FLAT SURF QUICK CTCH SPECI JAR PLPCTM PLASTIC CLR (MED SURG SUPPLIES) ×1 IMPLANT
TRAY GASTRIC LAV 36IN 34FR ARGYLE EDLICH MONOJECT LRG PVC 4 EYE CLS END GRAD SYRG TRNSPR 140CC NONST (MED SURG SUPPLIES) IMPLANT

## 2023-07-07 NOTE — Discharge Instructions (Signed)
Post operative care instructions review and/ or given to patient and/or appropriate responsible party. Some hand outs may not be patient specific. Patients should always follow the physicians specific instructions, which should be found on the front of the AVS, and/or given/ discussed in person. Procedure hand outs are for reference use only. Medication information given as appropriate to situation.     Hospital operator number [phone] to reach the physician on call (when applicable) after hours for non emergent important medical questions.     When appropriate: Patient should void post operatively within 6-8 hours please return to the closest ER if you are unable to urinate and become uncomfortable.       If you have been given medicine by vein to make you sleep during your surgery. This may have included both a pain medicine and sleeping medicine. Most of the effects have worn off. But you may still have some drowsiness for the next 6 to 8 hours.  Home care  Follow these guidelines when you get home:  For the next 8 hours, you should be watched by a responsible adult. This person should make sure your condition is not getting worse.  Don't drink any alcohol for the next 24 hours.  Don't drive, operate dangerous machinery, or make important business or personal decisions during the next 24 hours.  Note: Your healthcare provider may tell you not to take any medicine by mouth for pain or sleep in the next 4 hours. These medicines may react with the medicines you were given in the hospital. This could cause a much stronger response than usual.  Follow-up care  Follow up with your healthcare provider if you are not alert and back to your usual level of activity within 12 hours.  When to seek medical advice  Call your healthcare provider right away if any of these occur:  Drowsiness gets worse  Weakness or dizziness gets worse  Repeated vomiting  You can't be awakened  Fever  New rash    Signs of infection can  include but are not limited to:  -Fever, Chills  -Nausea, Vomiting  -Severe pain not relieved by prescribed medication(s)  -Redness,swelling, warmth of surgical site  -Abnormal or foul smelling drainage or discharge.     Circulation issues can include but are not limited to:  - New onset numbness, tingling, pain, swelling  - Coldness of extremity   - Change in color    Return to the ER if any of the above or below occur:  - New onset shortness of breath   - Chest pain  - Bleeding   .

## 2023-07-07 NOTE — H&P (Signed)
[facility]  Operating/Endoscopy Suite H&P/Update    Date:   07/07/2023  Name: Frederick Leon  Age: 38 y.o.    Referring Provider:    Edwin Cap, DO  [address]  [address]  [place],  [place] [address]    Primary Care Provider:    Dimas Alexandria, DO    Chief Complaint: BRBPR      HPI:    On the day of endoscopy, there is no acute emesis.  On the day of endoscopy, there is no acute mental status changes.      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     HX STRABISMUS SURGERY  1989    bil rectus muscle eye surgery         Family Medical History:       Problem Relation (Age of Onset)    Healthy Mother    Heart Attack Father    Lymphoma Father    No Known Problems Sister, Brother, Daughter            Social History     Socioeconomic History    Marital status: Married     Spouse name: KONSTANTIN LEHNEN    Number of children: 1   Occupational History    Occupation: self employed Visual merchandiser   Tobacco Use    Smoking status: Never    Smokeless tobacco: Never   Vaping Use    Vaping status: Never Used   Substance and Sexual Activity    Alcohol use: Yes     Comment: socially    Drug use: No     Sexual activity: Yes     Partners: Female   Other Topics Concern    Abuse/Domestic Violence No    Caffeine Concern No    Seat Belt No     Comment: "Sometimes"    Special Diet No    Uses Gait Assitive Device (cane, walker, etc) No       Examination:  BP 119/63   Pulse 70   Temp 36 C (96.8 F)   Resp 18   Ht 1.829 m (6')   Wt 95.3 kg (210 lb 1.6 oz)   SpO2 95%   BMI 28.49 kg/m        Wt Readings from Last 2 Encounters:   07/07/23 95.3 kg (210 lb 1.6 oz)   07/01/23  103 kg (226 lb 6.6 oz)     General: no distress  Lungs: Thoracic excursion normal.  No audible stridor.  Cardiovascular: peripheral pulses are present  Abdomen: Soft, non-tender, non-distended  Extremities: No cyanosis or edema  Neurologic: Grossly normal      Assessment  Frederick Leon is a 38 y.o. male with a chief complaint of BRBPR    Plan  Colonoscopy    No contraindications to planned surgery

## 2023-07-07 NOTE — Anesthesia Postprocedure Evaluation (Signed)
Anesthesia Post Op Evaluation    Patient: Frederick Leon  Procedure(s):  COLONOSCOPY with polypectomies    Last Vitals:Temperature: 36 C (96.8 F) (07/07/23 1236)  Heart Rate: 67 (07/07/23 1350)  BP (Non-Invasive): (!) 90/58 (07/07/23 1350)  Respiratory Rate: 18 (07/07/23 1350)  SpO2: 97 % (07/07/23 1350)    No notable events documented.    Patient is sufficiently recovered from the effects of anesthesia to participate in the evaluation and has returned to their pre-procedure level.  Patient location during evaluation: bedside       Patient participation: complete - patient participated  Level of consciousness: awake and alert and responsive to verbal stimuli    Pain score: 0  Pain management: adequate  Airway patency: patent    Anesthetic complications: no  Cardiovascular status: acceptable  Respiratory status: acceptable  Hydration status: acceptable  Patient post-procedure temperature: Pt Normothermic   PONV Status: Absent

## 2023-07-07 NOTE — Anesthesia Preprocedure Evaluation (Signed)
ANESTHESIA PRE-OP EVALUATION  Planned Procedure: COLONOSCOPY  Review of Systems     anesthesia history negative     patient summary reviewed  nursing notes reviewed        Pulmonary   sleep apnea and CPAP,   Cardiovascular  negative cardio ROS,   No peripheral edema,        GI/Hepatic/Renal           Endo/Other   neg endo/other ROS,       Neuro/Psych/MS    Substance use (social ETOH use), alcohol     Cancer    negative hematology/oncology ROS,                     Physical Assessment      Airway       Mallampati: I    TM distance: >3 FB    Neck ROM: full  Mouth Opening: good.            Dental       Dentition intact             Pulmonary    Breath sounds clear to auscultation  (-) no rhonchi, no decreased breath sounds, no wheezes, no rales and no stridor     Cardiovascular    Rhythm: regular  Rate: Normal  (-) no friction rub, carotid bruit is not present, no peripheral edema and no murmur     Other findings              Plan  ASA 2     Planned anesthesia type: general     total intravenous anesthesia                    Intravenous induction     Anesthesia issues/risks discussed are: Sore Throat, Intraoperative Awareness/ Recall, Cardiac Events/MI, Post-op Cognitive Dysfunction, PONV, Nerve Injuries, Dental Injuries, Post-op Intubation/Ventilation, Difficult Airway, Aspiration, Stroke and Post-op Agitation/Tantrum.  Anesthetic plan and risks discussed with patient  signed consent obtained          Patient's NPO status is appropriate for Anesthesia.           Plan discussed with CRNA.

## 2023-07-08 LAB — SURGICAL PATHOLOGY SPECIMEN

## 2023-07-09 ENCOUNTER — Ambulatory Visit (HOSPITAL_BASED_OUTPATIENT_CLINIC_OR_DEPARTMENT_OTHER): Payer: Self-pay | Admitting: FAMILY MEDICINE

## 2023-07-12 NOTE — Result Encounter Note (Signed)
 Resulted reviewed.  Will discuss with patient at their follow up appointment.

## 2023-07-18 ENCOUNTER — Emergency Department
Admission: EM | Admit: 2023-07-18 | Discharge: 2023-07-18 | Disposition: A | Discharge status: Home / Self Care | Payer: No Typology Code available for payment source

## 2023-07-18 ENCOUNTER — Other Ambulatory Visit: Payer: Self-pay

## 2023-07-18 ENCOUNTER — Emergency Department (HOSPITAL_COMMUNITY): Payer: No Typology Code available for payment source | Admitting: Radiology

## 2023-07-18 ENCOUNTER — Encounter (HOSPITAL_COMMUNITY): Payer: Self-pay

## 2023-07-18 DIAGNOSIS — W458XXA Other foreign body or object entering through skin, initial encounter: Secondary | ICD-10-CM | POA: Insufficient documentation

## 2023-07-18 DIAGNOSIS — S6991XA Unspecified injury of right wrist, hand and finger(s), initial encounter: Secondary | ICD-10-CM

## 2023-07-18 DIAGNOSIS — S61011A Laceration without foreign body of right thumb without damage to nail, initial encounter: Secondary | ICD-10-CM | POA: Insufficient documentation

## 2023-07-18 DIAGNOSIS — W298XXA Contact with other powered powered hand tools and household machinery, initial encounter: Secondary | ICD-10-CM

## 2023-07-18 DIAGNOSIS — Z23 Encounter for immunization: Secondary | ICD-10-CM | POA: Insufficient documentation

## 2023-07-18 DIAGNOSIS — S61419A Laceration without foreign body of unspecified hand, initial encounter: Secondary | ICD-10-CM

## 2023-07-18 DIAGNOSIS — T708XXA Other effects of air pressure and water pressure, initial encounter: Secondary | ICD-10-CM

## 2023-07-18 MED ORDER — CEPHALEXIN 500 MG CAPSULE
500.0000 mg | ORAL_CAPSULE | Freq: Four times a day (QID) | ORAL | 0 refills | Status: DC
Start: 2023-07-18 — End: 2023-07-29
  Filled 2023-07-18: qty 28, 7d supply, fill #0

## 2023-07-18 MED ORDER — SODIUM CHLORIDE 0.9 % INTRAVENOUS PIGGYBACK
4.5000 g | INTRAVENOUS | Status: AC
Start: 2023-07-18 — End: 2023-07-18
  Administered 2023-07-18: 4.5 g via INTRAVENOUS
  Administered 2023-07-18: 0 g via INTRAVENOUS

## 2023-07-18 MED ORDER — CEPHALEXIN 500 MG CAPSULE
500.0000 mg | ORAL_CAPSULE | Freq: Four times a day (QID) | ORAL | 0 refills | Status: DC
Start: 2023-07-18 — End: 2023-07-29

## 2023-07-18 MED ORDER — TETANUS IMMUNE GLOBULIN (PF) 250 UNIT/ML INTRAMUSCULAR SYRINGE
250.0000 [IU] | INJECTION | INTRAMUSCULAR | Status: DC
Start: 2023-07-18 — End: 2023-07-18

## 2023-07-18 MED ORDER — DIPHTH,PERTUSSIS(ACEL),TETANUS 2.5 LF UNIT-8 MCG-5 LF/0.5ML IM SYRINGE
0.5000 mL | INJECTION | Freq: Once | INTRAMUSCULAR | Status: AC
Start: 2023-07-18 — End: 2023-07-18
  Administered 2023-07-18: 0.5 mL via INTRAMUSCULAR
  Filled 2023-07-18: qty 0.5

## 2023-07-18 MED ORDER — SODIUM CHLORIDE 0.9 % INTRAVENOUS PIGGYBACK
4.5000 g | INTRAVENOUS | Status: DC
Start: 2023-07-18 — End: 2023-07-18
  Administered 2023-07-18: 0 g via INTRAVENOUS
  Filled 2023-07-18: qty 20

## 2023-07-18 NOTE — ED Triage Notes (Signed)
 [facility] - Emergency Department   Physician/APP in Triage Note  Medical Screening Exam     Date and Time of Assessment: 07/18/2023 13:47     Chief Complaint   Patient presents with    Laceration     Small laceration to right thumb from pressure washer       Brief HPI: Pressure washer injection injury to the right hand. Had water in it only. Last tetanus about 7 years ago. Ordered for update here. Still able to range the hand and thumb without issue. No other pain complaints.    Focused Physical Exam:   ED Triage Vitals   BP (Non-Invasive) 07/18/23 1342 109/70   Heart Rate 07/18/23 1342 54   Respiratory Rate 07/18/23 1342 18   Temperature 07/18/23 1342 37.1 C (98.8 F)   SpO2 07/18/23 1342 97 %   Weight 07/18/23 1341 102 kg (225 lb 1.4 oz)   Height 07/18/23 1341 1.829 m (6')     Physical:  MSK: Right hand with 1cm laceration to the extensor surface of the thumb, bleeding controlled. Edema to right forearm, non-tender to palpation. Normal ROM at elbow and wrist. Normal ROM to all digits. Extension and flexion of thumb intact. +SILT, strong distal pulses.    Preliminary Plan:  Imaging ordered  Patient roomed in ED          /Laurianne Floresca "Geannie Risen, MD 07/18/2023, 13:56   Department of Emergency Medicine  [facility]

## 2023-07-18 NOTE — Consults (Signed)
 [facility]  Department of Orthopaedics  H&P / Consult Note  Date of Service: 07/18/2023      Patient: Frederick Leon  MRN: [id number]  DOB: 1985/10/13    Admission Date: 07/18/2023  Ortho Staff: Gwyndolyn Kaufman  Requesting Service/Staff: ED    PCP:   Dimas Alexandria, DO    Consult Reason:   Right thumb pressure washer injury    HPI:   Frederick Leon is a 38 y.o. male with a right thumb pressure washer injury. He was washing his car today and went to spray a bucket with the hose. He bucket got away from him and pulled his hand into the line of the pressure washer. He denies any paresthesias or any other injuries.    PMH:  - OSA    PSH:  - Eye surgery  - No h/o problems with anaesthesia    ALLERGIES:  Allergies   Allergen Reactions    Nkda [No Known Drug Allergies]      HOME MEDICATIONS:  - Anticoagulation = None  Prior to Admission Medications   Prescriptions Last Dose Informant Patient Reported? Taking?   multivit-min/folic/vit K/lycop (ONE-A-DAY MEN'S MULTIVITAMIN ORAL)   Yes No   Sig: Take by mouth      Facility-Administered Medications: None     SH:   - Tobacco = None    FH:  - No family h/o anesthesia problems per patient's knowledge  - No family h/o DVT's per patient's knowledge     ROS:   Per HPI, otherwise negative    PHYSICAL EXAM:          Recent vitals:    BP 121/80   Pulse 59   Temp 37.1 C (98.8 F)   Resp 16   Ht 1.829 m (6')   Wt 102 kg (225 lb 1.4 oz)   SpO2 95%   BMI 30.53 kg/m     Gen: Alert and cooperative with exam, NAD, obese  Integument: Warm and dry  Neuro: Facies symmetric  Psych: Mood and affect congruent with clinical situation  HEENT: NC/AT  Resp: Non-labored breathing, able to speak without difficulty  CV: Regular pulse   Msk:  EXTREMITY = RUE  - Deformity: No obvious deformity appreciated   - Skin / Wounds: Small wound present on dorsal aspect of right thumb   - Pain: TTP of thumb  - Sensory: Sensation present on radial and ulnar aspects of thumb  - Motor: Able to  flex and extend thumb IP joint    Pictures:          IMAGING:  Films here:  - Right hand XR does not demonstrate any acute fractures    ASSESSMENT:  38 y.o. male s/p water pressure washer injury    PLAN/RECOMMENDATIONS:   - Please place "ortho consult" in Epic  - No urgent surgical intervention at this time by Orthopaedics  - Right thumb irrigated in ED and covered with soft dressing  - Zosyn provided in ED  - Pain control per ED  - Outpatient antibiotic prescription of Keflex ordered.  - Educated pt on warning signs / symptoms and advised to go to the nearest ED if these appear  - Ok to d/c from ED from Ortho standpoint  - Follow-up with Dr. Gwyndolyn Kaufman in 1 week (order placed in Epic)    --    Lucilla Edin. Luan Pulling, MD  Orthopaedic Surgery Resident, PGY-2  [facility]  Pager [phone]  07/18/2023 17:47  I was consulted about the care of this patient. I reviewed and discussed diagnosis and recommended the plan of care. The note was documented by the resident or APP who discussed the care of this patient with me.    Berton Lan, MD  07/19/2023, 07:53  Professor of Orthopedic Surgery  Division of Hand Surgery  [facility]

## 2023-07-18 NOTE — ED Provider Notes (Signed)
 [facility]  Department of Emergency Medicine  HPI - 07/18/2023    Attending: Dr. Farrel Demark  APP: Janyth Pupa, FNP-C    Chief Complaint:  Laceration    History of Present Illness:  Frederick Leon, 38 y.o. male who presents to the ED via private vehicle with c/o laceration. Patient sustained a laceration to his right thumb while operating a pressure washer. He states his pain was previously a 4-5 and is currently a 0. TDAP updated in the ED.   Pt denies any other concerns or complaints at this time.   No pertinent PMHx.   No pertinent SHx.  Pt is not on blood thinners.   NKDA.      ROS:  Systems negative and positive per HPI.      Medications:  Prior to Admission Medications   Prescriptions Last Dose Informant Patient Reported? Taking?   multivit-min/folic/vit K/lycop (ONE-A-DAY MEN'S MULTIVITAMIN ORAL)   Yes No   Sig: Take by mouth      Facility-Administered Medications: None       Allergies:  Allergies   Allergen Reactions    Nkda [No Known Drug Allergies]        Past Medical History:  Past Medical History:   Diagnosis Date    Amblyopia of right eye     Cataract     bilateral     Cornea scar 1985    right eye due to scar corna repositioning    CPAP (continuous positive airway pressure) dependence     [name]     Sleep apnea     Strabismus     Wears glasses            Past Surgical History:  Past Surgical History:   Procedure Laterality Date    HX CATARACT REMOVAL  02/19/08    bilateral    HX EYE SURGERY  7/85     corneal rotation right eye .     HX STRABISMUS SURGERY  1989    bil rectus muscle eye surgery           Social History:  Social History     Socioeconomic History    Marital status: Married     Spouse name: BOSTYN BOGIE    Number of children: 1    Years of education: Not on file    Highest education level: Not on file   Occupational History    Occupation: self employed farmer   Tobacco Use    Smoking status: Never    Smokeless tobacco: Never   Vaping Use    Vaping status:  Never Used   Substance and Sexual Activity    Alcohol use: Yes     Comment: socially    Drug use: No    Sexual activity: Yes     Partners: Female   Other Topics Concern    Abuse/Domestic Violence No    Caffeine Concern No    Calcium intake adequate Not Asked    Computer Use Not Asked    Exercise Concern Not Asked    Helmet Use Not Asked    Seat Belt No     Comment: "Sometimes"    Special Diet No    Sunscreen used Not Asked    Uses Gait Assitive Device (cane, walker, etc) No    Right hand dominant Not Asked    Left hand dominant Not Asked    Ambidextrous Not Asked    Drives Not Asked  Uses Cane Not Asked    Uses walker Not Asked    Uses wheelchair Not Asked    Shift Work Not Asked    Unusual Sleep-Wake Schedule Not Asked   Social History Narrative    Not on file     Social Determinants of Health     Financial Resource Strain: Not on file   Transportation Needs: Not on file   Social Connections: Not on file   Intimate Partner Violence: Not on file (07/04/2019)   Housing Stability: Not on file       Family History:  Family Medical History:       Problem Relation (Age of Onset)    Healthy Mother    Heart Attack Father    Lymphoma Father    No Known Problems Sister, Brother, Daughter              Physical Exam:  All nurse's notes reviewed.  Filed Vitals:    07/18/23 1500 07/18/23 1600 07/18/23 1700 07/18/23 1818   BP: 120/74 124/76 121/80 128/84   Pulse: 68  59 60   Resp: 18  16 16    Temp:       SpO2: 98% 96% 95% 96%       Constitutional: No acute distress.  HENT: Normocephalic. Atraumatic.  Cardiovascular: Regular rate.  Pulmonary/Chest: No respiratory distress.  Musculoskeletal: No deformity.  Psychiatric: Normal mood and affect. Behavior is normal.   Skin: There is a 1.5 cm well approximated laceration to the right thumb. Sensation and movement are intact. Capillary refill <2.   Neurological: Patient alert and responsive. No gross focal deficit.        Labs:  No results found for this or any previous visit (from the  past 24 hours).    Imaging:  Results for orders placed or performed during the hospital encounter of 07/18/23 (from the past 72 hours)   XR HAND RIGHT     Status: None    Narrative    Frederick Leon  Male, 38 years old.    XR HAND RIGHT performed on 07/18/2023 2:15 PM.    REASON FOR EXAM:  high pressure injection injury to right extensor surface of thumb    TECHNIQUE: 3 views/3 images submitted for interpretation.    COMPARISON:  None available    FINDINGS:  No acute fracture or traumatic malalignment of the right hand. No radiopaque foreign bodies are identified. There is soft tissue air seen throughout the right thumb and base of the right thumb/wrist.      Impression    Soft tissue air throughout the right thumb/volar wrist without underlying acute osseous abnormality.       Orders Placed This Encounter    XR HAND RIGHT    FOLLOW-UP: ORTHOPEDICS - HAND CLINIC - [facility] - [place], [place]    diphtheria, pertussis-acell, tetanus (BOOSTRIX) IM injection    piperacillin-tazobactam (ZOSYN) 4.5 g in NS 100 mL IVPB    cephalexin (KEFLEX) 500 mg Oral Capsule    cephalexin (KEFLEX) 500 mg Oral Capsule       Abnormal Lab results:  Labs Ordered/Reviewed - No data to display        Plan: Appropriate studies ordered. Medical Records reviewed.     Therapy/Procedures/Course/MDM:   Patient was vitally stable throughout visit.   Patient was seen by ortho, and wound care was provided by that service.  Lab work was not indicated for this encounter.  Results discussed with patient.  Consults:   ortho    Impression:   Clinical Impression   Laceration of hand (Primary)       Disposition:  Discharged  Following the above history, physical exam, and studies, the patient was deemed stable and suitable for discharge.  It was advised that the patient return to the ED with any new, concerning or worsening symptoms and follow up as directed.   The patient and family verbalized understanding of all instructions and had  no further questions or concerns.   Follow Up:   Hand Surgery, [facility]  [address]  [place] [address]  [phone]        Prescriptions:      Current Discharge Medication List        START taking these medications.        Details   * cephalexin 500 mg Capsule  Commonly known as: KEFLEX   500 mg, Oral, 4 TIMES DAILY  Qty: 28 Capsule  Refills: 0     * cephalexin 500 mg Capsule  Commonly known as: KEFLEX   500 mg, Oral, 4 TIMES DAILY  Qty: 28 Capsule  Refills: 0           * This list has 2 medication(s) that are the same as other medications prescribed for you. Read the directions carefully, and ask your doctor or other care provider to review them with you.                CONTINUE these medications - NO CHANGES were made during your visit.        Details   ONE-A-DAY MEN'S MULTIVITAMIN ORAL   Take by mouth  Refills: 0              I am scribing for, and in the presence of, Janyth Pupa, APRN, for services provided on 07/18/2023  Karl Bales, SCRIBE    // Karl Bales, SCRIBE  07/18/2023, 13:56    I personally performed the services described in this documentation, as scribed in my presence, and it is both accurate and complete.     The co-signing faculty was physically present and available for consultation and did not physically see this patient.  Janyth Pupa, APRN,NP-C  07/19/2023, 07:07

## 2023-07-18 NOTE — Consults (Incomplete)
 Department of Orthopaedics  H&P / Consult Note  Attending: {Ortho Attending:48249}  Ortho Service: {Ortho Service:48248}      Identification:  Name: Frederick Leon  Age and Gender: 38 y.o. male  Date of Birth: [birth year]  Date of Admission: 07/18/2023  MRN: [id number]  PCP: Dimas Alexandria, DO  Requesting Service: ED    Reason for Consult:   Right thumb pain    SUBJECTIVE:     History Limitations: None     HPI:   Frederick Leon is a 38 y.o. White male with Right thumb pain s/p finger laceration from power washer. Immediate  pain and deformity.    Presented directly to [facility]. Imaging revealed the above injury. Ortho consulted.  Currently complains of minimal right thumb pain. Denies numbness/tingling of involved extremity. Denies any N/V/D, fevers or chills, pain or paraesthesias in unaffected extremities, any other pain/injury, LOC, CP or SOB.     Last PO intake was yesterday evening.   Community ambulator prior to injury.  No Home blood thinner use.   No prior injury or surgery.    PMH:  Sleep apnea    PSH:  Eye surgery  No h/o problems with anaesthesia    Allergies:  NKA      Medications:  Anticoagulation = None    Prior to Admission Medications   Prescriptions Last Dose Informant Patient Reported? Taking?   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    OBJECTIVE    Physical Exam:          Vital signs: BP 109/70   Pulse 54   Temp 37.1 C (98.8 F)   Resp 18   Ht 1.829 m (6')   Wt 102 kg (225 lb 1.4 oz)   SpO2 97%   BMI 30.53 kg/m       Constitutional: NAD. A&Ox3. Fair historian. No family at bedside.    Spine:   Musculoskeletal:       RUE:  No pain with active or passive range of motion of the wrist, elbow, shoulder  2+ U/R pulses***  SILT U/M/R/Ax***  +AIN / PIN intact,Thumbs up, ok sign, finger cross***  ***       LUE:  No pain with active or passive range of motion of the wrist, elbow, shoulder  2+ U/R pulses***  SILT U/M/R/Ax***  +AIN / PIN intact, Thumbs up, ok sign, finger cross***  ***  RLE:  No pain with active or passive range of motion of the ankle, knee, hip  2+ DP/PT pulses  SILT S/S/T/DP/SP  +EHL/FHL/ADF/APF/***  ABI:***  ***       LLE:  No pain with active or passive range of motion of the ankle, knee, hip  2+ DP/PT pulses  SILT S/S/T/DP/SP  +EHL/FHL/ADF/APF/***  ABI:***  ***      Pictures:    (07/18/2023 - 15:28)    Imaging:   OSH films:   ***    Films here:  ***    Pertinent Labs:   Lab Results   Component Value Date    WBC 6.6 06/28/2023    HGB 15.9 06/28/2023    HGB 15.0 01/28/2018    HCT 45.5 06/28/2023    PLTCNT 310 06/28/2023     WBC: ***  ESR: ***  CRP: ***    HGB: ***  PLTS: ***    PT/INR: ***  PTT: ***  ***    Assessment:  38 y.o. male s/p *** with ***    Procedures:  ***    Plan/Recommendations:   Please place "Ortho Consult" in Epic.***  {JP ZOXW:96045}  Pre-op workup (labs***, EKG***, CXR***) ordered  ***Consented.***  ***Pending operative clearance by primary / admitting team ***    Admission: {JP Admission:49349}  Consults: {JP consults:49355}  Imaging: {JP imaging:49367}  Weightbearing Status: {WBing Status:39231} {JP Extremity:49348} ***  PT/OT: OOB with PT/OT after surgery***  DVT prophylaxis:  {JPDVT:49347}  Antibiotics/Boosters: {JP Abx:49350}  Diet: {jpdiet:49345}  Pain Management: {JP WUJW:11914}  Will discuss with staff & plan will be updated as necessary***    Consent Statement: Written consent was obtained from the patient *** / patient's ***. This included a discussion of both operative and nonoperative options, as well as the associated risks/benefits with each option. Patient demonstrated understanding and all questions were answered.***    Thedore Mins, MD 07/18/2023, 15:28  Resident, [facility] Department of Orthopaedics

## 2023-07-19 ENCOUNTER — Telehealth (HOSPITAL_COMMUNITY): Payer: Self-pay | Admitting: FAMILY MEDICINE

## 2023-07-19 NOTE — Progress Notes (Signed)
 Post Ed Follow-Up    Post ED Follow-Up:   Document completed and/or attempted interactive contact(s) after transition to home after emergency department stay.:   Transition Facility and relevant Date:   Discharge Date: 07/18/23  Discharge from [facility] Emergency Department?: Yes  Discharge Facility: [facility]  Contacted by: Zadie Cleverly, RN  Contact method: Patient/Caregiver Telephone, MyChart Patient Portal  Contact first attempt: 07/19/2023 11:44 AM  Contact second attempt: 07/19/2023 11:45 AM  MyChart message sent?: Yes  Medications prescribed: Yes  Follow Up Visit: No answer - left message

## 2023-07-21 ENCOUNTER — Ambulatory Visit: Payer: No Typology Code available for payment source | Attending: Physician Assistant | Admitting: Physician Assistant

## 2023-07-21 ENCOUNTER — Other Ambulatory Visit: Payer: Self-pay

## 2023-07-21 ENCOUNTER — Encounter (HOSPITAL_BASED_OUTPATIENT_CLINIC_OR_DEPARTMENT_OTHER): Payer: Self-pay | Admitting: Physician Assistant

## 2023-07-21 VITALS — BP 133/77 | HR 69 | Temp 97.6°F | Resp 16 | Ht 72.0 in | Wt 231.7 lb

## 2023-07-21 DIAGNOSIS — K635 Polyp of colon: Secondary | ICD-10-CM | POA: Insufficient documentation

## 2023-07-21 DIAGNOSIS — K648 Other hemorrhoids: Secondary | ICD-10-CM | POA: Insufficient documentation

## 2023-07-21 DIAGNOSIS — Z860102 Personal history of hyperplastic colon polyps: Secondary | ICD-10-CM

## 2023-07-21 DIAGNOSIS — Z9889 Other specified postprocedural states: Secondary | ICD-10-CM

## 2023-07-21 DIAGNOSIS — Z712 Person consulting for explanation of examination or test findings: Secondary | ICD-10-CM

## 2023-07-21 NOTE — Progress Notes (Signed)
 Gastroenterology Service [place], [place] [address]    Date:   07/21/2023  Name: Frederick Leon  Age: 38 y.o.    Referring Provider:    Suzan Nailer, NP  [address]  [address]  [place],  [place] [address]    Primary Care Provider:    Dimas Alexandria, DO    Chief Complaint: Follow Up and No Complaints  Reason for GI Consult:  Rectal Bleeding/Bloating/Fatigue    Assessment and Plan  Frederick Leon is a 38 y.o. male who presents with Follow Up and No Complaints.  Past medical history is significant for [name] and strabismus.      Assessment/Plan   1. Hyperplastic colonic polyp    2. Internal hemorrhoids      Recommendations:  Colonoscopy 07/07/2023 revealed two 6 mm polyps and non-bleeding internal hemorrhoids. Pathology with hyperplastic polyps.  Given no known family history, 10 year recall would be recommended. Patient would be more comfortable repeating surveillance scope at age 50 (2030); will place recall.  CBC and CMP WNL 06/28/2023    Risks/Benefits of procedure discussed with patient? N/A  Pt advised extra-intestinal positive ROS not discussed on this visit should be addressed with the primary service.  Follow up will be scheduled in the GI clinic prn.    ++++++++++++++++++++++++++++++++++++++++++++++++++++++++++++++++++++++++++++++++++++++++++++++++++++++++++++++++++  History of Present Illness  Frederick Leon is a 38 y.o. male who presents today for follow-up s/p colonoscopy 07/07/2023. Patient was initially referred to [place] by family medicine on 06/28/2023 after he noted experiencing BRBPR with wiping and increased abdominal bloating over the last 6 months. Patient reported having 2-4 bowel movements daily without pain or sensation of incomplete emptying. Denied family history of colon cancer or IBD.    Today, patient states bowels are moving well. He hasn't had any bright red blood with bowel movements since the scope. When it was occurring, patient reports it was once a month  or once every two months. Denies melena, heartburn, trouble swallowing, nausea, vomiting, or other complaints. Denies tobacco use. He drinks maybe a beer a week. He only uses NSAIDs when sick but otherwise denies use.    Anticoagulants (last 24 hours)       None          Past Medical History  Past Medical History:   Diagnosis Date    Amblyopia of right eye     Cataract     bilateral     Cornea scar 1985    right eye due to scar corna repositioning    CPAP (continuous positive airway pressure) dependence     [name]     Sleep apnea     Strabismus     Wears glasses      Past Surgical History:   Procedure Laterality Date    HX CATARACT REMOVAL  02/19/08    bilateral    HX EYE SURGERY  7/85     corneal rotation right eye .     HX STRABISMUS SURGERY  1989    bil rectus muscle eye surgery     Allergies   Allergen Reactions    Nkda [No Known Drug Allergies]      Outpatient Medications  Current Outpatient Medications   Medication Sig    cephalexin (KEFLEX) 500 mg Oral Capsule Take 1 Capsule (500 mg total) by mouth Four times a day for 7 days (Patient not taking: Reported on 07/21/2023)    cephalexin (KEFLEX) 500 mg Oral Capsule Take 1  Capsule (500 mg total) by mouth Four times a day for 7 days (Patient not taking: Reported on 07/21/2023)    multivit-min/folic/vit K/lycop (ONE-A-DAY MEN'S MULTIVITAMIN ORAL) Take by mouth     Family History  Family History   Problem Relation Age of Onset    Healthy Mother     Lymphoma Father     Heart Attack Father     No Known Problems Sister     No Known Problems Brother     No Known Problems Daughter      Social History  Social History     Tobacco Use    Smoking status: Never    Smokeless tobacco: Never   Vaping Use    Vaping status: Never Used   Substance Use Topics    Alcohol use: Yes     Comment: socially    Drug use: No     Review of Systems  See HPI    Examination:  BP 133/77   Pulse 69   Temp 36.4 C (97.6 F) (Temporal)   Resp 16   Ht 1.829 m (6')   Wt 105 kg (231 lb 11.3  oz)   SpO2 97%   BMI 31.42 kg/m     Wt Readings from Last 2 Encounters:   07/21/23 105 kg (231 lb 11.3 oz)   07/18/23 102 kg (225 lb 1.4 oz)     General: no distress and vital signs reviewed  Eyes: Conjunctiva clear, sclera non-icteric.   HENT:Head atraumatic and normocephalic  Neck: No JVD or tracheal deviation  Lungs: Thoracic excursion  Normal  Neurologic: Grossly normal. Alert and oriented x3  Psychiatric: Normal    Data/Chart reviewed:  Previous labs reviewed?  yes   06/28/2023: CBC and CMP WNL, A1C 5.1, lipid panel relatively unremarkable    Previous imaging reviewed?  No    Previous biopsy and pathology/EKG/outside records?  Yes  Colonoscopy 07/07/2023:  A. DESCENDING COLON POLYP, POLYPECTOMY:        - Hyperplastic polyp.     B. SPLENIC FLEXURE COLON POLYP, POLYPECTOMY:        - Hyperplastic polyp.      Images personally reviewed?  no  My interpretation:  N/A    The above data may have been collected via chart review, discussion with other providers, review of outside records, discussion with the pts family/friends, and or obtained by the patient.  Not all of the above information was necessarily discussed with the patient.  The above stated documentation is for the medical record and communication/coordination of care for the pts best interest.  If the patient/family has any questions about the above stated material, this can be answered at the next scheduled visit or during hospital rounds if the patient is hospitalized.    Sedalia Muta, PA-C  [facility] - Gastroenterology  [place], [place]  [address]

## 2023-07-21 NOTE — Nursing Note (Signed)
 Patient here for follow up with no complaints

## 2023-07-25 ENCOUNTER — Encounter: Payer: BC Managed Care – PPO | Admitting: Internal Medicine

## 2023-07-28 ENCOUNTER — Ambulatory Visit (HOSPITAL_BASED_OUTPATIENT_CLINIC_OR_DEPARTMENT_OTHER): Payer: Self-pay | Admitting: Physician Assistant

## 2023-07-29 ENCOUNTER — Encounter (HOSPITAL_BASED_OUTPATIENT_CLINIC_OR_DEPARTMENT_OTHER): Payer: Self-pay | Admitting: Physician Assistant

## 2023-07-29 ENCOUNTER — Ambulatory Visit: Payer: Self-pay | Attending: Physician Assistant | Admitting: Physician Assistant

## 2023-07-29 ENCOUNTER — Other Ambulatory Visit: Payer: Self-pay

## 2023-07-29 VITALS — Temp 98.1°F | Ht 72.72 in | Wt 228.4 lb

## 2023-07-29 DIAGNOSIS — S6991XA Unspecified injury of right wrist, hand and finger(s), initial encounter: Secondary | ICD-10-CM | POA: Insufficient documentation

## 2023-07-29 DIAGNOSIS — W298XXA Contact with other powered powered hand tools and household machinery, initial encounter: Secondary | ICD-10-CM

## 2023-07-29 DIAGNOSIS — S60931A Unspecified superficial injury of right thumb, initial encounter: Secondary | ICD-10-CM

## 2023-07-29 NOTE — Progress Notes (Signed)
 [facility]  DEPARTMENT OF [place], [place] [address]    PATIENT NAME: Frederick Leon NUMBER: [id number]  DATE OF SERVICE: 07/29/2023  DATE OF BIRTH: 05-Aug-1985    HISTORY & PHYSICAL    CC: ED follow up for right thumb pressure washer injury    DOI: 07/18/2023     HPI:  Frederick Leon is a 38 y.o. male new patient presenting with c/o of follow up for right thumb pressure washer injury.  He says that on 07/18/2023, he accidentally sprayed his thumb with a pressure washer well washing his car.  The pressure washer only had water in it with no sober other additives.  He presented to the ED where the wound was washed out and he was prescribed antibiotics.  Today, the patient is doing well.  The wound is completely scabbed over with no redness, drainage, or any signs of infection.  He finished the antibiotics a few days ago.  He has been doing his normal activities without any issues.    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     HX STRABISMUS SURGERY  1989    bil rectus muscle eye surgery     MEDICATIONS:  has a current medication list which includes the following prescription(s): multivit-min/folic/vit k/lycop.    ALLERGIES:  Allergies   Allergen Reactions    Nkda [No Known Drug Allergies]      SOCIAL HISTORY:  The patient is self-employed  Social History     Tobacco Use    Smoking status: Never    Smokeless tobacco: Never   Substance Use Topics    Alcohol use: Yes     Comment: socially       FAMILY  HISTORY:  Family Medical History:       Problem Relation (Age of Onset)    Healthy Mother    Heart Attack Father    Lymphoma Father    No Known Problems Sister, Brother, Daughter          REVIEW OF SYSTEMS:  Other than ROS in the HPI, all other systems were negative.      PHYSICAL EXAM:  Constitutional: Awake, alert, no acute distress  Psych: Normal affect, Patient is pleasant and cooperative  Vital signs: Temp 36.7 C (98.1 F)   Ht 1.847 m (6' 0.72")   Wt 104 kg (228 lb 6.3 oz)   BMI 30.37 kg/m   Head: Normocephalic, atraumatic  Cardiac: 2+ regular peripheral pulses, brisk capillary refill  Respiratory: Symmetric nonlabored breathing, no acute respiratory distress  Skin:  Right thumb wound is scabbed over with no redness, drainage, or any signs of infection.  See photo below.  Musculoskeletal: The patient has full range of motion of the right thumb.  The thumb joint is stable to stress.  Sensation of the hand is intact to light touch.        DATA  Past notes reviewed  Past images reviewed  Past lab results reviewed     IMAGING  No new imaging today    ASSESSMENT:  A 38 year old male following up for a right thumb water pressure washer injury    PLAN:   The patient has done well since the injury.  He is okay to resume normal activities as he tolerates.  Advised about signs of infection and told him to return to clinic if he experiences these.  He can follow up on a p.r.n. basis.  Patient voiced understanding of this plan and all questions were answered.     -This patient was seen and evaluated in clinic today independently-  Brock Ra, PA-C  Department of Orthopaedics  Hand & Upper Extremity Surgery  07/29/2023, 12:52

## 2023-08-04 ENCOUNTER — Ambulatory Visit (HOSPITAL_BASED_OUTPATIENT_CLINIC_OR_DEPARTMENT_OTHER): Payer: Self-pay | Admitting: NURSE PRACTITIONER

## 2023-08-30 ENCOUNTER — Encounter (HOSPITAL_BASED_OUTPATIENT_CLINIC_OR_DEPARTMENT_OTHER): Payer: Self-pay

## 2023-09-01 ENCOUNTER — Ambulatory Visit: Payer: Self-pay | Attending: NURSE PRACTITIONER | Admitting: NURSE PRACTITIONER

## 2023-09-01 ENCOUNTER — Encounter (HOSPITAL_BASED_OUTPATIENT_CLINIC_OR_DEPARTMENT_OTHER): Payer: Self-pay | Admitting: NURSE PRACTITIONER

## 2023-09-01 ENCOUNTER — Other Ambulatory Visit: Payer: Self-pay

## 2023-09-01 VITALS — BP 126/76 | HR 60 | Temp 97.8°F | Ht 72.0 in | Wt 228.2 lb

## 2023-09-01 DIAGNOSIS — G4733 Obstructive sleep apnea (adult) (pediatric): Secondary | ICD-10-CM | POA: Insufficient documentation

## 2023-09-01 DIAGNOSIS — R0683 Snoring: Secondary | ICD-10-CM | POA: Insufficient documentation

## 2023-09-01 DIAGNOSIS — R0681 Apnea, not elsewhere classified: Secondary | ICD-10-CM | POA: Insufficient documentation

## 2023-09-01 NOTE — Progress Notes (Signed)
 [facility]  [address]  [place] [address]  Phone: [phone]  Fax: [phone]    NAME: Frederick Leon  DOB:  [birth year]  AGE:  38 y.o.  MRN:  [id number]  APPT:  09/01/2023  9:30 AM EDT    Primary Care Physician: Dimas Alexandria, DO   Referring Physician: Bernarda Caffey, DO  [address]  [address]  [place],  [place] [address]    Reason for Referral:  OSA      History of Present Illness    A very pleasant 38 y.o. male presents to the [facility] for evaluation of OSA.  The patient was referred by Dr. Toni Amend for OSA and CPAP therapy management.  Mr. Bunn states he was diagnosed with OSA in approximately 2022 when he lived in [place].  Has been utilizing CPAP therapy ever since.  Does notice some improvement in his sleep symptoms since starting CPAP therapy.  Would like to be re-evaluated to see if he still has sleep apnea.  Mr. Chrissie Noa states he is unable to obtain his previous sleep records as when he went back for a follow up appointment in [place] the office was packed up and non-existent anymore.    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     HX STRABISMUS SURGERY  1989    bil rectus muscle eye surgery           Social history:   Social History     Tobacco Use   Smoking Status Never   Smokeless Tobacco Never       Family Medical History:       Problem Relation (Age of Onset)    Healthy Mother    Heart Attack Father    Lymphoma Father    No Known Problems Sister, Brother, Daughter               Allergies:  Nkda [no known drug allergies]    Medications:  Current Outpatient Medications   Medication Sig    multivit-min/folic/vit K/lycop (ONE-A-DAY MEN'S MULTIVITAMIN ORAL) Take by mouth       PHYSICAL EXAM:   BP 126/76   Pulse 60   Temp 36.6 C (97.8 F)   Ht 1.829 m (6')   Wt 104 kg (228 lb 2.8 oz)   SpO2 96%   BMI 30.95 kg/m         Gen: No distress  Respiratory: Symmetric chest expansion.    Neurological:  Awake and alert    DATA:       ASSESSMENT / PLAN:    * OSA    (09/01/2023):  The patient is here today to establish care for OSA.  The patient has been utilizing and benefitting from CPAP therapy.  Today in the office we reviewed his compliance data from his CPAP machine.  Averaging 7 hours of use per night.  Residual AHI of 0.5.  Currently on auto adjusting 8-20 CWP.  Utilizes an over the nose fullface mask, which he finds comfortable.  Discussed with the patient that it is likely his insurance will require him to have a repeat sleep study in order to receive new supplies, since there are no records of his previous sleep study.  The patient would like to be re-evaluated to see if he still has OSA anyways.  We will proceed with an unattended sleep study (HST) and will call the patient with the results once we receive them.  Instructed the patient not to utilize his CPAP on the night of his home sleep study.  We will see the patient back in 3 months for compliance review.      Return to clinic in 3 months for follow-up or sooner if any issues arise.    30 minutes spent on the day of evaluation including pre visit preparation, face-to-face time and EMR documentation.    Balinda Quails, FNP-C

## 2023-09-01 NOTE — Patient Instructions (Signed)
 Pulmonary, Physician Office Building  [address]  [place] [address]  [phone]    HOME SLEEP TESTING       A home sleep test is designed to be a convenient way to collect information about your sleep. This test will provide a sleep physician with the information he or she needs to diagnose obstructive sleep apnea. Home sleep testing allows you to sleep at home wearing equipment that collects information about how you breathe during sleep. You will set up the testing equipment yourself.     On the day of the test:  Try to follow your regular routine as much as possible  Avoid napping  Eliminate caffeine after lunch.     Before your home sleep test, you will have to come to the sleep lab to pick up the equipment. A member of the sleep lab will give you instructions on how to use the home sleep test device. This will be an opportunity for you to ask questions if there is anything you do not understand.    THE INDEX FINGER ON ONE HAND MUST BE FREE OF ANY ARTIFICAL NAIL OR NAIL POLISH    You can go to sleep at your regular bedtime. When you are ready to sleep, you will attach the sensors to your body as instructed. When you wake up in the morning you will remove the sensors and fill out your post sleep questionnaire. You will have to return the sleep equipment to the sleep lab at 630 pm following your test.    The sleep lab will score and interpret the information collected through your home sleep test. The board-certified sleep physician will contact you to discuss the results. If the results are unclear, the physician may recommend an in-lab study.      You may need an additional in-lab study if:  Your home sleep test did not record enough data for a physician to make a diagnosis  Your home sleep test results indicate that you do not have obstructive sleep apnea and the physician suspects another sleep disorder.                              If you are diagnosed with obstructive sleep apnea, the  physician will discuss treatment options with you and develop a plan.         LOCATION: (Traveling [place]) [address] to [address] at [facility] > Turn left onto [address] for approx.9 to 10 miles > (NOT including the light at top of exit) you will encounter 4 traffic lights> at the 4th light (intersection [address]) turn   left (at [facility] sign) [address] continued.>Travel approx. 3 to 4 miles>look for large brown brick building on left>Tall bIue sign on left at the entrance identified as [facility]. If you are traveling WEST on [address], through [place] (past the [facility]), at the bridge by [address] > turn left>at next traffic light [employer] on left)>tum right, up blue sign will be on your right.   If traveling [place] on [address], take the same exit ([address])>turn Right onto [address].  Follow the same directions as above but you will encounter only 3 lights.  At the 3rd light turn left onto [address] and follow the same direction as above.   Address: [address] [place], [place] [address] Ph: [phone]    Polysomnography instructions:  You are undergoing further evaluation to assess an underlying sleep disorder.   Scheduling your sleep study is affected by your insurance approval and the sleep lab schedule availability.  Please call the clinic at [phone] in 2 weeks from today's appointment if your sleep study has not yet been scheduled.    Please call the clinic at [phone] in 2 weeks following your sleep study if you have not yet been contacted by a medical equipment company to provide you with a device OR  if you need to know your sleep study results.

## 2023-09-26 ENCOUNTER — Other Ambulatory Visit: Payer: Self-pay

## 2023-09-26 ENCOUNTER — Ambulatory Visit: Payer: No Typology Code available for payment source | Attending: Family Medicine | Admitting: FAMILY MEDICINE

## 2023-09-26 ENCOUNTER — Encounter (HOSPITAL_BASED_OUTPATIENT_CLINIC_OR_DEPARTMENT_OTHER): Payer: Self-pay | Admitting: FAMILY MEDICINE

## 2023-09-26 VITALS — BP 134/80 | HR 62 | Temp 98.0°F | Resp 16 | Ht 72.0 in | Wt 219.0 lb

## 2023-09-26 DIAGNOSIS — G4730 Sleep apnea, unspecified: Secondary | ICD-10-CM | POA: Insufficient documentation

## 2023-09-26 DIAGNOSIS — K625 Hemorrhage of anus and rectum: Secondary | ICD-10-CM | POA: Insufficient documentation

## 2023-09-26 DIAGNOSIS — T7840XA Allergy, unspecified, initial encounter: Secondary | ICD-10-CM | POA: Insufficient documentation

## 2023-09-26 NOTE — Progress Notes (Signed)
 [address]  [place] [address]  Phone: [phone]  Fax: [phone]     Family Medicine Progress Note    Name: Frederick Leon  MRN : [id number]  Date of service: 09/26/2023      History of Present Illness  Frederick Leon is a 38 y.o. male patient with a pertinent medical history of sleep apnea who had concerns including Sleep Apnea (Patient is scheduled for at home sleep study 10/09/23) and Allergies.  Patient returns for follow-up after visit 3 months ago where he reported bright red blood per rectum.  Patient has since had a colonoscopy which showed internal hemorrhoids but was otherwise benign.  Patient has not had return of bleeding since his last visit.  Patient reports that he previously followed with sleep medicine in [place] .  He believes that he only had 17 hypopneas on the night of the sleep study, but was prescribed a CPAP anyway.  He does not find benefit in the use of the CPAP as he continues to snore with a fullface mask.  He was referred to pulmonology at the last visit and will undergo a home sleep study later this month to determine if he truly has sleep apnea and his need for CPAP therapy.  Patient is slightly congested this morning, but he fed his animals hay this morning and attributes his congestion to this.  He had seasonal allergies as a child for which he took Claritin with improvement in his symptoms.    ROS:   Review of Systems   Constitutional:  Negative for fever and unexpected weight change.   HENT:  Positive for congestion. Negative for ear pain, postnasal drip, rhinorrhea and sore throat.    Eyes:  Negative for visual disturbance.   Respiratory:  Negative for cough, chest tightness, shortness of breath and wheezing.    Cardiovascular:  Negative for chest pain, palpitations and leg swelling.   Gastrointestinal:  Negative for abdominal pain, anal bleeding, blood in stool, constipation, diarrhea, nausea and vomiting.   Genitourinary:   Negative for difficulty urinating, dysuria, frequency, hematuria and urgency.   Musculoskeletal:  Negative for arthralgias, back pain, gait problem and myalgias.   Skin:  Negative for rash.   Neurological:  Negative for dizziness, syncope, weakness and headaches.   Psychiatric/Behavioral:  Negative for agitation and confusion.         Historical Data    Patient Active Problem List   Diagnosis    [name]    Strabismus    Amblyopia of right eye    Cornea scar    Cataract       Current Outpatient Medications   Medication Sig    multivit-min/folic/vit K/lycop (ONE-A-DAY MEN'S MULTIVITAMIN ORAL) Take by mouth            Objective  Vital Signs:  BP 134/80   Pulse 62   Temp 36.7 C (98 F) (Thermal Scan)   Resp 16   Ht 1.829 m (6')   Wt 99.3 kg (219 lb)   SpO2 98%   BMI 29.70 kg/m       Physical Exam:  Physical Exam  Constitutional:       General: He is not in acute distress.  HENT:      Head: Normocephalic.      Nose: Congestion present.      Mouth/Throat:      Mouth: Mucous membranes are moist.   Eyes:      General: No scleral  icterus.     Conjunctiva/sclera: Conjunctivae normal.   Cardiovascular:      Rate and Rhythm: Normal rate and regular rhythm.      Heart sounds: Normal heart sounds. No murmur heard.     No friction rub. No gallop.   Pulmonary:      Effort: Pulmonary effort is normal. No respiratory distress.      Breath sounds: Normal breath sounds. No wheezing, rhonchi or rales.   Abdominal:      General: Abdomen is flat. Bowel sounds are normal.      Palpations: Abdomen is soft.      Tenderness: There is no abdominal tenderness. There is no guarding or rebound.   Musculoskeletal:         General: Normal range of motion.      Cervical back: Neck supple.      Right lower leg: No edema.      Left lower leg: No edema.   Skin:     General: Skin is warm.      Findings: No rash.   Neurological:      General: No focal deficit present.      Mental Status: He is alert.   Psychiatric:         Mood and  Affect: Mood normal.         Behavior: Behavior normal.         Thought Content: Thought content normal.             Assessment and Plan  1. Bright red blood per rectum (Primary)  Patient has not experienced any additional bright red blood per rectum since his last visit.  Colonoscopy was benign with internal hemorrhoids present.  Surgery recommended a 10 year follow-up for screening for colorectal cancer.    2. Sleep apnea, unspecified type  Patient is scheduled for a home sleep study later this month which will determine whether he truly has sleep apnea and his need for CPAP therapy.    3.Allergies  Patient was advised to resume Claritin as needed for seasonal allergies.    Follow up: Return in about 1 year (around 09/25/2024).    Fransico Ivy, DO 09/26/2023 10:44  [facility] - Family Medicine Residency  [facility]     Supervising Physician's Statement    I discussed the patient's care with the Resident prior to the patient leaving the clinic. Any significant discussion points are noted .    Silvano Drop, MD

## 2023-10-05 ENCOUNTER — Ambulatory Visit
Admission: RE | Admit: 2023-10-05 | Discharge: 2023-10-05 | Disposition: A | Discharge status: Home / Self Care | Payer: Self-pay | Source: Ambulatory Visit | Attending: NURSE PRACTITIONER | Admitting: NURSE PRACTITIONER

## 2023-10-05 ENCOUNTER — Other Ambulatory Visit: Payer: Self-pay

## 2023-10-05 DIAGNOSIS — G4733 Obstructive sleep apnea (adult) (pediatric): Secondary | ICD-10-CM | POA: Insufficient documentation

## 2023-10-05 DIAGNOSIS — R0683 Snoring: Secondary | ICD-10-CM | POA: Insufficient documentation

## 2023-10-05 DIAGNOSIS — R0681 Apnea, not elsewhere classified: Secondary | ICD-10-CM | POA: Insufficient documentation

## 2023-11-04 ENCOUNTER — Ambulatory Visit (HOSPITAL_BASED_OUTPATIENT_CLINIC_OR_DEPARTMENT_OTHER): Payer: Self-pay

## 2023-11-04 DIAGNOSIS — G4733 Obstructive sleep apnea (adult) (pediatric): Secondary | ICD-10-CM

## 2023-11-18 NOTE — Telephone Encounter (Addendum)
 LVM for patient to return call to office.  Ginger Bruckner, CMA      ----- Message from Tawni Cancer, FNP-C sent at 11/04/2023  9:22 AM EDT -----  Please let patient know.  Home sleep study still shows evidence of mild sleep apnea.  Continue to use CPAP machine.  Placed order for new machine and supplies.  ----- Message -----  From: Edwina Passe In  Sent: 11/03/2023   5:13 PM EDT  To: Jazzriel Clark-Casto, FNP-C

## 2023-12-01 ENCOUNTER — Ambulatory Visit (HOSPITAL_BASED_OUTPATIENT_CLINIC_OR_DEPARTMENT_OTHER): Payer: Self-pay | Admitting: NURSE PRACTITIONER

## 2023-12-27 ENCOUNTER — Telehealth (HOSPITAL_BASED_OUTPATIENT_CLINIC_OR_DEPARTMENT_OTHER): Payer: Self-pay

## 2023-12-27 ENCOUNTER — Telehealth (HOSPITAL_BASED_OUTPATIENT_CLINIC_OR_DEPARTMENT_OTHER): Payer: Self-pay | Admitting: NURSE PRACTITIONER

## 2023-12-27 NOTE — Telephone Encounter (Signed)
 Patient left message to see if he needs to keep appointment tomorrow. Left voicemail to return call.  Landry Montenegro, LPN

## 2023-12-27 NOTE — Telephone Encounter (Signed)
 Patient lvm stating he has questions that pertained to his appt that is scheduled for 12/28/23.    Returned call to patient, lvm for patient to return call to our office.  Ginger Bruckner, CMA

## 2023-12-28 ENCOUNTER — Ambulatory Visit (HOSPITAL_BASED_OUTPATIENT_CLINIC_OR_DEPARTMENT_OTHER): Payer: Self-pay | Admitting: NURSE PRACTITIONER

## 2024-02-13 ENCOUNTER — Other Ambulatory Visit: Payer: Self-pay

## 2024-02-13 ENCOUNTER — Ambulatory Visit: Payer: Self-pay | Attending: NURSE PRACTITIONER | Admitting: NURSE PRACTITIONER

## 2024-02-13 ENCOUNTER — Encounter (HOSPITAL_BASED_OUTPATIENT_CLINIC_OR_DEPARTMENT_OTHER): Payer: Self-pay | Admitting: NURSE PRACTITIONER

## 2024-02-13 VITALS — BP 119/75 | HR 55 | Temp 98.0°F | Ht 72.0 in | Wt 231.5 lb

## 2024-02-13 DIAGNOSIS — G4733 Obstructive sleep apnea (adult) (pediatric): Secondary | ICD-10-CM | POA: Insufficient documentation

## 2024-03-04 NOTE — Progress Notes (Signed)
 [facility]  [address]  [place] [address]  Phone: [phone]  Fax: [phone]    NAME: Frederick Leon  DOB:  09/23/1985  AGE:  38 y.o.  MRN:  [id number]  APPT:  02/13/2024 10:45 AM EDT    Primary Care Physician: Isaiah JONELLE Ada, DO   Referring Physician: Charmaine Elvie Folks, DO  [address]  [address]  [place],  [place] [address]    Reason for Referral:  OSA      History of Present Illness    A very pleasant 38 y.o. male presents to the [facility] for evaluation of OSA.  The patient was referred by Dr. Charmaine for OSA and CPAP therapy management.  Mr. Biggins states he was diagnosed with OSA in approximately 2022 when he lived in [place] .  Has been utilizing CPAP therapy ever since.  Does notice some improvement in his sleep symptoms since starting CPAP therapy.  Would like to be re-evaluated to see if he still has sleep apnea.  Mr. Elsie states he is unable to obtain his previous sleep records as when he went back for a follow up appointment in [place]  the office was packed up and non-existent anymore.  ==========================================  Interval History:    The patient is here today to follow-up on OSA.  Mr. Esguerra and up having an unattended sleep study in May of 2025 that showed an AHI of 9.3 and then was able to obtain a new CPAP machine.  The patient states he continues to notice improvement in his sleep symptoms with CPAP therapy.    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     HX  STRABISMUS SURGERY  1989    bil rectus muscle eye surgery           Social history:   Social History     Tobacco Use   Smoking Status Never   Smokeless Tobacco Never       Family Medical History:       Problem Relation (Age of Onset)    Healthy Mother    Heart Attack Father    Lymphoma Father    No Known Problems Sister, Brother, Daughter              Allergies:  Nkda [no known drug allergies]    Medications:  Current Outpatient Medications   Medication Sig    multivit-min/folic/vit K/lycop (ONE-A-DAY MEN'S MULTIVITAMIN ORAL) Take by mouth       PHYSICAL EXAM:   BP 119/75   Pulse 55   Temp 36.7 C (98 F)   Ht 1.829 m (6')   Wt 105 kg (231 lb 7.7 oz)   SpO2 98%   BMI 31.39 kg/m  Gen: No distress  Respiratory: Symmetric chest expansion.    Neurological:  Awake and alert    DATA:       ASSESSMENT / PLAN:    * OSA (AHI 9.3)    (09/01/2023):  The patient is here today to establish care for OSA.  The patient has been utilizing and benefitting from CPAP therapy.  Today in the office we reviewed his compliance data from his CPAP machine.  Averaging 7 hours of use per night.  Residual AHI of 0.5.  Currently on auto adjusting 8-20 CWP.  Utilizes an over the nose fullface mask, which he finds comfortable.  Discussed with the patient that it is likely his insurance will require him to have a repeat sleep study in order to receive new supplies, since there are no records of his previous sleep study.  The patient would like to be re-evaluated to see if he still has OSA anyways.  We will proceed with an unattended sleep study (HST) and will call the patient with the results once we receive them.  Instructed the patient not to utilize his CPAP on the night of his home sleep study.  We will see the patient back in 3 months for compliance review.    (02/13/2024):  The patient is here today to follow up after having an unattended sleep study in May of 2025 that showed an AHI of 9 3.  The patient was started on auto  adjusting CPAP therapy.  Today in the office we reviewed compliance data together.  Averaging 4 hours of use per night.  Currently on auto adjusting 8-20 CWP.  Residual AHI of 2.3.  Patient was congratulated on excellent use of CPAP therapy.  We will see the patient back in 1 year for compliance review.      Return to clinic in 1 year for follow-up or sooner if any issues arise.    20 minutes spent on the day of evaluation including pre visit preparation, face-to-face time and EMR documentation.    Plato Alspaugh Clark-Casto, FNP-C

## 2024-10-01 ENCOUNTER — Ambulatory Visit (HOSPITAL_BASED_OUTPATIENT_CLINIC_OR_DEPARTMENT_OTHER): Admitting: FAMILY MEDICINE

## 2025-02-12 ENCOUNTER — Ambulatory Visit (HOSPITAL_BASED_OUTPATIENT_CLINIC_OR_DEPARTMENT_OTHER): Payer: Self-pay | Admitting: NURSE PRACTITIONER
